# Patient Record
Sex: Male | Born: 1965 | Race: White | Hispanic: No | Marital: Married | Smoking: Never smoker
Health system: Southern US, Community
[De-identification: ages and names within clinical notes are randomized; demographics above are authoritative.]

## PROBLEM LIST (undated history)

## (undated) DIAGNOSIS — T7840XA Allergy, unspecified, initial encounter: Secondary | ICD-10-CM

## (undated) DIAGNOSIS — K219 Gastro-esophageal reflux disease without esophagitis: Secondary | ICD-10-CM

## (undated) HISTORY — DX: Allergy, unspecified, initial encounter: T78.40XA

## (undated) HISTORY — PX: MOUTH SURGERY: SHX715

## (undated) HISTORY — DX: Gastro-esophageal reflux disease without esophagitis: K21.9

---

## 2012-11-01 HISTORY — PX: VASECTOMY: SHX75

## 2014-01-31 ENCOUNTER — Ambulatory Visit: Payer: Self-pay | Admitting: Family Medicine

## 2015-01-03 ENCOUNTER — Emergency Department: Payer: BC Managed Care – PPO

## 2015-01-03 ENCOUNTER — Emergency Department
Admission: EM | Admit: 2015-01-03 | Discharge: 2015-01-04 | Disposition: A | Discharge status: Home / Self Care | Payer: BC Managed Care – PPO | Attending: Emergency Medicine | Admitting: Emergency Medicine

## 2015-01-03 ENCOUNTER — Encounter: Payer: Self-pay | Admitting: Emergency Medicine

## 2015-01-03 DIAGNOSIS — R1031 Right lower quadrant pain: Secondary | ICD-10-CM | POA: Diagnosis present

## 2015-01-03 DIAGNOSIS — K4090 Unilateral inguinal hernia, without obstruction or gangrene, not specified as recurrent: Secondary | ICD-10-CM | POA: Insufficient documentation

## 2015-01-03 LAB — COMPREHENSIVE METABOLIC PANEL
ALK PHOS: 52 U/L (ref 38–126)
ALT: 34 U/L (ref 17–63)
AST: 24 U/L (ref 15–41)
Albumin: 4.9 g/dL (ref 3.5–5.0)
Anion gap: 9 (ref 5–15)
BUN: 22 mg/dL — ABNORMAL HIGH (ref 6–20)
CO2: 25 mmol/L (ref 22–32)
CREATININE: 0.88 mg/dL (ref 0.61–1.24)
Calcium: 9.6 mg/dL (ref 8.9–10.3)
Chloride: 105 mmol/L (ref 101–111)
GFR calc Af Amer: >60 mL/min (ref >60)
Glucose, Bld: 92 mg/dL (ref 65–99)
Potassium: 3.4 mmol/L — ABNORMAL LOW (ref 3.5–5.1)
Sodium: 139 mmol/L (ref 135–145)
Total Bilirubin: 1.8 mg/dL — ABNORMAL HIGH (ref 0.3–1.2)
Total Protein: 7.9 g/dL (ref 6.5–8.1)

## 2015-01-03 LAB — CBC
HEMATOCRIT: 45.6 % (ref 40.0–52.0)
Hemoglobin: 15.6 g/dL (ref 13.0–18.0)
MCH: 28.3 pg (ref 26.0–34.0)
MCHC: 34.3 g/dL (ref 32.0–36.0)
MCV: 82.6 fL (ref 80.0–100.0)
PLATELETS: 175 10*3/uL (ref 150–440)
RBC: 5.52 MIL/uL (ref 4.40–5.90)
RDW: 14 % (ref 11.5–14.5)
WBC: 6.8 10*3/uL (ref 3.8–10.6)

## 2015-01-03 LAB — LIPASE, BLOOD: Lipase: 25 U/L (ref 22–51)

## 2015-01-03 MED ORDER — OXYCODONE-ACETAMINOPHEN 5-325 MG PO TABS: 1.0000 | ORAL_TABLET | Freq: Four times a day (QID) | ORAL | Status: DC | PRN

## 2015-01-03 MED ORDER — MORPHINE SULFATE 4 MG/ML IJ SOLN
4.0000 mg | Freq: Once | INTRAMUSCULAR | Status: DC
  Filled 2015-01-03: qty 1

## 2015-01-03 MED ORDER — IOHEXOL 300 MG/ML  SOLN
100.0000 mL | Freq: Once | INTRAMUSCULAR | Status: AC | PRN
  Administered 2015-01-03: 100 mL via INTRAVENOUS

## 2015-01-03 MED ORDER — IOHEXOL 240 MG/ML SOLN: 25.0000 mL | Freq: Once | INTRAMUSCULAR | Status: DC | PRN

## 2015-01-03 NOTE — ED Provider Notes (Signed)
-----------------------------------------   11:59 PM on 01/03/2015 -----------------------------------------  Patient care is him from Dr. Scotty Court. CT largely within normal limits besides some mild spermatic cord swelling, could be the result of the reduced hernia. Patient to follow up with general surgery on an outpatient basis. We will discharge the patient home. I discussed strict return precautions with the patient which he is agreeable.  Minna Antis, MD 01/03/15 [phone]

## 2015-01-03 NOTE — ED Notes (Signed)
Pt reports RLQ pain/right groin pain that started around 1830 tonight. Pt describes as a burning sensation, states "there's a bulge there". Pt denies nausea, vomiting or diarrhea. Pt able to urinate since then, swelling to bladder area, denies swelling to testicles.

## 2015-01-03 NOTE — ED Notes (Signed)
Pt returned from ct at this time

## 2015-01-03 NOTE — ED Notes (Signed)
MD Stafford at bedside. 

## 2015-01-03 NOTE — ED Provider Notes (Signed)
[facility] Emergency Department Provider Note  ____________________________________________  Time seen: 10:20 PM  I have reviewed the triage vital signs and the nursing notes.   HISTORY  Chief Complaint Groin Pain    HPI Joel Leon is a 49 y.o. male who complains of sudden onset worsening severe pain in the right lower abdomen that started about 6:30 PM tonight. When he got home around 8 PM he noticed that there was a bulge in the right groin area. He denies any pain in his testicles or scrotum. No dysuria or hematuria or penile discharge. No fevers or chills. Never had anything like this before. No heavy lifting or trauma. Only surgical history is vasectomy 2 years ago. Denies medical problems.  Pain is nonradiating, worsened with movement, no alleviating factors. No nausea vomiting diarrhea.   History reviewed. No pertinent past medical history.  There are no active problems to display for this patient.   History reviewed. No pertinent past surgical history.  Current Outpatient Rx  Name  Route  Sig  Dispense  Refill  . oxyCODONE-acetaminophen (ROXICET) 5-325 MG per tablet   Oral   Take 1 tablet by mouth every 6 (six) hours as needed for severe pain.   10 tablet   0    None Allergies Antihistamines, diphenhydramine-type; Decongestant; Milk-related compounds; and Sudafed  No family history on file.  Social History History  Substance Use Topics  . Smoking status: Never Smoker   . Smokeless tobacco: Not on file  . Alcohol Use: No    Review of Systems  Constitutional: No fever or chills. No weight changes Eyes:No blurry vision or double vision.  ENT: No sore throat. Cardiovascular: No chest pain. Respiratory: No dyspnea or cough. Gastrointestinal: Abdominal pain as above  No BRBPR or melena. Genitourinary: Negative for dysuria, urinary retention, bloody urine, or difficulty urinating. Musculoskeletal: Negative for back pain. No  joint swelling or pain. Skin: Negative for rash. Neurological: Negative for headaches, focal weakness or numbness. Psychiatric:No anxiety or depression.   Endocrine:No hot/cold intolerance, changes in energy, or sleep difficulty.  10-point ROS otherwise negative.  ____________________________________________   PHYSICAL EXAM:  VITAL SIGNS: ED Triage Vitals  Enc Vitals Group     BP 01/03/15 2122 158/90 mmHg     Pulse Rate 01/03/15 2122 69     Resp 01/03/15 2122 20     Temp 01/03/15 2122 97.6 F (36.4 C)     Temp Source 01/03/15 2122 Oral     SpO2 01/03/15 2122 97 %     Weight 01/03/15 2122 185 lb (83.915 kg)     Height 01/03/15 2122 5\' 11"  (1.803 m)     Head Cir --      Peak Flow --      Pain Score 01/03/15 2123 8     Pain Loc --      Pain Edu? --      Excl. in GC? --      Constitutional: Alert and oriented. Moderate distress due to pain. Eyes: No scleral icterus. No conjunctival pallor. PERRL. EOMI ENT   Head: Normocephalic and atraumatic.   Nose: No congestion/rhinnorhea. No septal hematoma   Mouth/Throat: MMM, no pharyngeal erythema. No peritonsillar mass. No uvula shift.   Neck: No stridor. No SubQ emphysema. No meningismus. Hematological/Lymphatic/Immunilogical: No cervical lymphadenopathy. Cardiovascular: RRR. Normal and symmetric distal pulses are present in all extremities. No murmurs, rubs, or gallops. Respiratory: Normal respiratory effort without tachypnea nor retractions. Breath sounds are clear and equal  bilaterally. No wheezes/rales/rhonchi. Gastrointestinal: Soft with 2-3 cm firm mass palpable in the right suprapubic area at Hesselbach's triangle. This area is exquisitely tender.. No distention. There is no CVA tenderness.  No rebound, rigidity, or guarding. Genitourinary: Normal external genitalia Musculoskeletal: Nontender with normal range of motion in all extremities. No joint effusions.  No lower extremity tenderness.  No  edema. Neurologic:   Normal speech and language.  CN 2-10 normal. Motor grossly intact. No pronator drift.  Normal gait. No gross focal neurologic deficits are appreciated.  Skin:  Skin is warm, dry and intact. No rash noted.  No petechiae, purpura, or bullae. Psychiatric: Mood and affect are normal. Speech and behavior are normal. Patient exhibits appropriate insight and judgment.  ____________________________________________    LABS (pertinent positives/negatives) (all labs ordered are listed, but only abnormal results are displayed) Labs Reviewed  COMPREHENSIVE METABOLIC PANEL - Abnormal; Notable for the following:    Potassium 3.4 (*)    BUN 22 (*)    Total Bilirubin 1.8 (*)    All other components within normal limits  LIPASE, BLOOD  CBC   ____________________________________________   EKG    ____________________________________________    RADIOLOGY  CT abdomen and pelvis pending  ____________________________________________   PROCEDURES Right-sided direct inguinal hernia reduced at bedside with firm pressure causing release of firm tender subcutaneous mass through the abdominal wall. There is a palpable fascial defect in the area after reduction. Pain and tenderness are substantially improved after reduction. ____________________________________________   INITIAL IMPRESSION / ASSESSMENT AND PLAN / ED COURSE  Pertinent labs & imaging results that were available during my care of the patient were reviewed by me and considered in my medical decision making (see chart for details).  Patient presents with clinically apparent right direct inguinal hernia. This was reduced at bedside on initial examination. We will obtain a CT scan of the abdomen and pelvis to further evaluate this injury can ensure full reduction and no evidence of obstruction. The patient can then hopefully be discharged to follow-up with surgery. We'll give IV morphine for now for symptom  control.  ----------------------------------------- 10:32 PM on 01/04/2015 -----------------------------------------  Late entry progress note. Care of the patient was signed out to the oncoming physician Dr. Lenard Lance at 11 PM on 01/03/2015. Patient medically stable and hernia was reduced at bedside, plan to follow-up with outpatient general surgery if CT scan did not reveal any severe findings. ____________________________________________   FINAL CLINICAL IMPRESSION(S) / ED DIAGNOSES  Final diagnoses:  Direct inguinal hernia      Sharman Cheek, MD 01/04/15 2233

## 2015-01-03 NOTE — Discharge Instructions (Signed)

## 2015-01-05 ENCOUNTER — Telehealth: Payer: Self-pay | Admitting: Surgery

## 2015-01-05 NOTE — Telephone Encounter (Signed)
Returned phone call to patient at this time. Patient states that he has had multiple instances where he has had to reduce hernia and this has been painful to him. Hernia has been reduced each time without difficulty per patient.   Denies nausea and vomiting, fever, and severe pain at this time.  Explained to the patient that he can go to the drug store to see if they have an Abdominal Binder or Ace Wrap to put on. Also encouraged to hold hernia area anytime that he is coughing or sneezing.   Moved up appointment to 01/11/15- 1300 with Dr. Egbert Garibaldi.

## 2015-01-05 NOTE — Telephone Encounter (Signed)
Patient would like to talk with a nurse, he was recently diagnosed with a hernia at the Emergency room, he has a appt with Dr.Ely 8/19, he is out of town in [place] and having some symptoms and has a few questions.

## 2015-01-11 ENCOUNTER — Other Ambulatory Visit: Payer: Self-pay | Admitting: Specialist

## 2015-01-11 ENCOUNTER — Ambulatory Visit (INDEPENDENT_AMBULATORY_CARE_PROVIDER_SITE_OTHER): Payer: BC Managed Care – PPO | Admitting: Surgery

## 2015-01-11 VITALS — BP 134/87 | HR 68 | Temp 98.1°F | Ht 71.0 in | Wt 187.0 lb

## 2015-01-11 DIAGNOSIS — M25462 Effusion, left knee: Secondary | ICD-10-CM

## 2015-01-11 DIAGNOSIS — K4090 Unilateral inguinal hernia, without obstruction or gangrene, not specified as recurrent: Secondary | ICD-10-CM | POA: Insufficient documentation

## 2015-01-11 NOTE — Patient Instructions (Signed)
You are requesting to have your Hernia repaired. We will arrange to have this done on 01/23/15 at [facility]  Please see additional information given for details.  Call our office with any questions or concerns that you may develop.

## 2015-01-11 NOTE — Progress Notes (Signed)
Patient ID: Joel Leon, male   DOB: Oct 05, 1965, 49 y.o.   MRN: [id number]  Chief Complaint  Patient presents with  . Follow-up    Hospital Follow Up    HPI  Joel Leon is a 49 y.o. male.  Without past medical history who approximately one week ago had the sudden onset of right groin pain and bulge. He went to the emergency room. Diagnosed with a hernia at which point this was reduced. Following this a CT scan was obtained which demonstrated some inflammatory changes seen around the spermatic cord but no obvious hernia. Patient denies any prior hernia in the past. No abdominal operations including hernia surgery. The patient denies any urinary symptoms consisting of either dysuria or hematuria poor stream or nocturia. Since the diagnosis of the hernia proximal one week ago the patient is had intermittent bulging of the hernia and some discomfort.  He is interested in surgical options.  Note today in the office he is complaining of left knee pain. He is to see orthopedic surgery soon regarding this.  Past Medical History  Diagnosis Date  . Allergy   . GERD (gastroesophageal reflux disease)     Past Surgical History  Procedure Laterality Date  . Vasectomy  11/01/2012    Family History  Problem Relation Age of Onset  . Dementia Maternal Grandmother     Social History Social History  Substance Use Topics  . Smoking status: Never Smoker   . Smokeless tobacco: None  . Alcohol Use: 0.0 oz/week    0 Standard drinks or equivalent per week    Allergies  Allergen Reactions  . Antihistamines, Diphenhydramine-Type   . Decongestant [Oxymetazoline]   . Milk-Related Compounds   . Sudafed [Pseudoephedrine Hcl]     Current Outpatient Prescriptions  Medication Sig Dispense Refill  . esomeprazole (NEXIUM) 40 MG capsule Take 40 mg by mouth daily at 12 noon.    Marland Kitchen oxyCODONE-acetaminophen (ROXICET) 5-325 MG per tablet Take 1 tablet by mouth every 6 (six) hours as needed for severe  pain. (Patient not taking: Reported on 01/11/2015) 10 tablet 0   No current facility-administered medications for this visit.      Blood pressure 134/87, pulse 68, temperature 98.1 F (36.7 C), temperature source Oral, height  (1.803 m), weight 187 lb (84.823 kg).   Review of Systems  Constitutional: Negative for fever, chills and weight loss.  HENT: Negative.   Respiratory: Negative for cough and hemoptysis.   Cardiovascular: Negative for chest pain and palpitations.  Gastrointestinal: Negative.   Genitourinary: Negative for dysuria, urgency, frequency, hematuria and flank pain.  Musculoskeletal: Positive for joint pain.  Skin: Negative.   Psychiatric/Behavioral: Negative.     Physical Exam  Constitutional: He is oriented to person, place, and time and well-developed, well-nourished, and in no distress. No distress.  HENT:  Head: Atraumatic.  Eyes: Conjunctivae are normal. Pupils are equal, round, and reactive to light.  Cardiovascular: Normal rate.   Pulmonary/Chest: Effort normal. No respiratory distress.  Abdominal: Soft. Normal appearance. He exhibits no distension. Bowel sounds are not tinkling. There is no tenderness. There is no rebound. A hernia is present. Hernia confirmed positive in the right inguinal area. Hernia confirmed negative in the umbilical area.    There is a reducible moderate sized right indirect inguinal hernia.  Neurological: He is oriented to person, place, and time. Gait normal.  Skin: He is not diaphoretic.  Psychiatric: Mood, memory, affect and judgment normal.   Physical Exam CONSTITUTIONAL:  Pleasant, well-developed, well-nourished, and in no acute distress. EYES: Pupils equal and reactive to light, Sclera non-icteric EARS, NOSE, MOUTH AND THROAT:  The oropharynx was clear.  Dentition is good repair.  Oral mucosa pink and moist. LYMPH NODES:  Lymph nodes in the neck and axillae were normal RESPIRATORY:  Lungs were clear.  Normal  respiratory effort without pathologic use of accessory muscles of respiration CARDIOVASCULAR: Heart was regular without murmurs.  There were no carotid bruits. GI: The abdomen was soft, nontender, and nondistended. There were no palpable masses. There was no hepatosplenomegaly. There were normal bowel sounds in all quadrants. GU:  Rectal deferred.   MUSCULOSKELETAL:  Normal muscle strength and tone.  No clubbing or cyanosis.   SKIN:  There were no pathologic skin lesions.  There were no nodules on palpation. NEUROLOGIC:  Sensation is normal.  Cranial nerves are grossly intact. PSYCH:  Oriented to person, place and time.  Mood and affect are normal.  Data Reviewed I personally reviewed the noncontrast CT scan obtained date of his admission to the emergency room.  I have personally reviewed the patient's imaging, laboratory findings and medical records.    Assessment      49 year old male otherwise healthy with symptomatic right inguinal hernia. Left knee pain will be addressed by orthopedic surgery. Plan    I discussed with him the various options of repair including open and laparoscopically. I discussed with him a totally extraperitoneal approach with mesh. He understands and wishes to proceed. We discussed the small risk of recurrence, infection, cosmetic deformity, ischemic orchitis, and chronic pain. All of his questions were answered and he wishes to proceed. I discussed with him the expected postsurgical convalescence for this operative intervention.        Natale Lay MD, FACS 01/11/2015, 2:49 PM

## 2015-01-12 ENCOUNTER — Telehealth: Payer: Self-pay

## 2015-01-12 NOTE — Telephone Encounter (Signed)
Patient seen in office this week.   Spoke with [place] in OR to schedule surgery. Case # [id number]  Scheduled for Laparoscopic Right Inguinal Hernia Repair with Mesh and Balloon on 01/23/15 at 0830 with Dr. Egbert Garibaldi.  Dr. Excell Seltzer to assist and is aware of surgery date/time.  Pre-admission testing interview to be done over the phone on 8/18 between 12-5pm.

## 2015-01-12 NOTE — Telephone Encounter (Signed)
Please call patient with surgery, pre-admission, and insurance details.  Surgical orders are in chart and complete. Patient does not need to have pre-admission testing as this was just done during emergency visit.

## 2015-01-16 ENCOUNTER — Ambulatory Visit: Payer: BC Managed Care – PPO

## 2015-01-16 NOTE — Telephone Encounter (Signed)
Pt advised of pre op date/time and sx date. Sx: 01/23/15 with Dr Chales Abrahams right inguinal hernia repair with mesh and balloon Pre op: 01/18/15 bw 1-5pm--telephone.   Pt advised of physician estimate--$126.24. Pt stated he did not like paying over the phone with Credit Card. He was advised he could pay this after receiving bill.

## 2015-01-17 ENCOUNTER — Ambulatory Visit
Admission: RE | Admit: 2015-01-17 | Discharge: 2015-01-17 | Disposition: A | Discharge status: Home / Self Care | Payer: BC Managed Care – PPO | Source: Ambulatory Visit | Attending: Specialist | Admitting: Specialist

## 2015-01-17 DIAGNOSIS — M25562 Pain in left knee: Secondary | ICD-10-CM | POA: Diagnosis present

## 2015-01-17 DIAGNOSIS — M25462 Effusion, left knee: Secondary | ICD-10-CM | POA: Diagnosis not present

## 2015-01-17 DIAGNOSIS — S83232A Complex tear of medial meniscus, current injury, left knee, initial encounter: Secondary | ICD-10-CM | POA: Diagnosis not present

## 2015-01-17 DIAGNOSIS — X58XXXA Exposure to other specified factors, initial encounter: Secondary | ICD-10-CM | POA: Insufficient documentation

## 2015-01-18 ENCOUNTER — Encounter: Payer: Self-pay | Admitting: *Deleted

## 2015-01-18 ENCOUNTER — Inpatient Hospital Stay: Admission: RE | Admit: 2015-01-18 | Payer: BC Managed Care – PPO | Source: Ambulatory Visit

## 2015-01-18 DIAGNOSIS — Z888 Allergy status to other drugs, medicaments and biological substances status: Secondary | ICD-10-CM | POA: Diagnosis not present

## 2015-01-18 DIAGNOSIS — Z91011 Allergy to milk products: Secondary | ICD-10-CM | POA: Diagnosis not present

## 2015-01-18 DIAGNOSIS — Z79899 Other long term (current) drug therapy: Secondary | ICD-10-CM | POA: Diagnosis not present

## 2015-01-18 DIAGNOSIS — K219 Gastro-esophageal reflux disease without esophagitis: Secondary | ICD-10-CM | POA: Diagnosis not present

## 2015-01-18 DIAGNOSIS — K4090 Unilateral inguinal hernia, without obstruction or gangrene, not specified as recurrent: Secondary | ICD-10-CM | POA: Diagnosis present

## 2015-01-18 DIAGNOSIS — Z8489 Family history of other specified conditions: Secondary | ICD-10-CM | POA: Diagnosis not present

## 2015-01-18 MED ORDER — SODIUM CHLORIDE 0.9 % IJ SOLN
INTRAMUSCULAR | Status: AC
  Filled 2015-01-18: qty 10

## 2015-01-18 NOTE — Patient Instructions (Signed)
  Your procedure is scheduled on:01/23/15 Report to Day Surgery. [facility] SECOND FLOOR To find out your arrival time please call [phone] between 1PM - 3PM on8/22/16 Remember: Instructions that are not followed completely may result in serious medical risk, up to and including death, or upon the discretion of your surgeon and anesthesiologist your surgery may need to be rescheduled.    X____ 1. Do not eat food or drink liquids after midnight. No gum chewing or hard candies.     _X___ 2. No Alcohol for 24 hours before or after surgery.   ____ 3. Bring all medications with you on the day of surgery if instructed.    ___X_ 4. Notify your doctor if there is any change in your medical condition     (cold, fever, infections).     Do not wear jewelry, make-up, hairpins, clips or nail polish.  Do not wear lotions, powders, or perfumes. You may wear deodorant.  Do not shave 48 hours prior to surgery. Men may shave face and neck.  Do not bring valuables to the hospital.    [facility] is not responsible for any belongings or valuables.               Contacts, dentures or bridgework may not be worn into surgery.  Leave your suitcase in the car. After surgery it may be brought to your room.  For patients admitted to the hospital, discharge time is determined by your                treatment team.   Patients discharged the day of surgery will not be allowed to drive home.   Please read over the following fact sheets that you were given:   Surgical Site Infection Prevention   _X___ Take these medicines the morning of surgery with A SIP OF WATER:    1. NEXIUM  2.   3.   4.  5.  6.  ____ Fleet Enema (as directed)   _X___ Use CHG Soap as directed  ____ Use inhalers on the day of surgery  ____ Stop metformin 2 days prior to surgery    ____ Take 1/2 of usual insulin dose the night before surgery and none on the morning of surgery.   ____ Stop Coumadin/Plavix/aspirin on   ____  Stop Anti-inflammatories on    ____ Stop supplements until after surgery.    ____ Bring C-Pap to the hospital.

## 2015-01-19 ENCOUNTER — Ambulatory Visit: Payer: Self-pay | Admitting: Surgery

## 2015-01-23 ENCOUNTER — Ambulatory Visit: Payer: BC Managed Care – PPO | Admitting: Certified Registered Nurse Anesthetist

## 2015-01-23 ENCOUNTER — Ambulatory Visit
Admission: RE | Admit: 2015-01-23 | Discharge: 2015-01-23 | Disposition: A | Discharge status: Home / Self Care | Payer: BC Managed Care – PPO | Source: Ambulatory Visit | Attending: Surgery | Admitting: Surgery

## 2015-01-23 ENCOUNTER — Encounter
Admission: RE | Disposition: A | Discharge status: Home / Self Care | Payer: Self-pay | Source: Ambulatory Visit | Attending: Surgery

## 2015-01-23 ENCOUNTER — Encounter: Payer: Self-pay | Admitting: *Deleted

## 2015-01-23 DIAGNOSIS — K219 Gastro-esophageal reflux disease without esophagitis: Secondary | ICD-10-CM | POA: Insufficient documentation

## 2015-01-23 DIAGNOSIS — Z8489 Family history of other specified conditions: Secondary | ICD-10-CM | POA: Insufficient documentation

## 2015-01-23 DIAGNOSIS — K4090 Unilateral inguinal hernia, without obstruction or gangrene, not specified as recurrent: Secondary | ICD-10-CM | POA: Diagnosis not present

## 2015-01-23 DIAGNOSIS — Z91011 Allergy to milk products: Secondary | ICD-10-CM | POA: Insufficient documentation

## 2015-01-23 DIAGNOSIS — K469 Unspecified abdominal hernia without obstruction or gangrene: Secondary | ICD-10-CM | POA: Insufficient documentation

## 2015-01-23 DIAGNOSIS — Z79899 Other long term (current) drug therapy: Secondary | ICD-10-CM | POA: Insufficient documentation

## 2015-01-23 DIAGNOSIS — Z888 Allergy status to other drugs, medicaments and biological substances status: Secondary | ICD-10-CM | POA: Insufficient documentation

## 2015-01-23 HISTORY — PX: INGUINAL HERNIA REPAIR: SHX194

## 2015-01-23 SURGERY — REPAIR, HERNIA, INGUINAL, LAPAROSCOPIC
Anesthesia: General | Wound class: Clean

## 2015-01-23 MED ORDER — ENOXAPARIN SODIUM 40 MG/0.4ML ~~LOC~~ SOLN
40.0000 mg | Freq: Once | SUBCUTANEOUS | Status: AC
  Administered 2015-01-23: 40 mg via SUBCUTANEOUS
  Filled 2015-01-23: qty 0.4

## 2015-01-23 MED ORDER — CEFAZOLIN SODIUM-DEXTROSE 2-3 GM-% IV SOLR
2.0000 g | INTRAVENOUS | Status: AC
  Administered 2015-01-23: 2 g via INTRAVENOUS

## 2015-01-23 MED ORDER — ROCURONIUM BROMIDE 100 MG/10ML IV SOLN
INTRAVENOUS | Status: DC | PRN
Start: 2015-01-23 — End: 2015-01-23
  Administered 2015-01-23: 5 mg via INTRAVENOUS
  Administered 2015-01-23: 30 mg via INTRAVENOUS
  Administered 2015-01-23 (×2): 10 mg via INTRAVENOUS

## 2015-01-23 MED ORDER — CEFAZOLIN SODIUM-DEXTROSE 2-3 GM-% IV SOLR
INTRAVENOUS | Status: AC
Start: 2015-01-23 — End: 2015-01-23
  Administered 2015-01-23: 2 g via INTRAVENOUS
  Filled 2015-01-23: qty 50

## 2015-01-23 MED ORDER — SUGAMMADEX SODIUM 200 MG/2ML IV SOLN
INTRAVENOUS | Status: DC | PRN
  Administered 2015-01-23: 160 mg via INTRAVENOUS

## 2015-01-23 MED ORDER — ONDANSETRON HCL 4 MG/2ML IJ SOLN
INTRAMUSCULAR | Status: DC | PRN
  Administered 2015-01-23: 4 mg via INTRAVENOUS

## 2015-01-23 MED ORDER — PROPOFOL 10 MG/ML IV BOLUS
INTRAVENOUS | Status: DC | PRN
  Administered 2015-01-23: 80 mg via INTRAVENOUS
  Administered 2015-01-23: 150 mg via INTRAVENOUS
  Administered 2015-01-23: 100 mg via INTRAVENOUS

## 2015-01-23 MED ORDER — DEXAMETHASONE SODIUM PHOSPHATE 4 MG/ML IJ SOLN
INTRAMUSCULAR | Status: DC | PRN
  Administered 2015-01-23: 10 mg via INTRAVENOUS

## 2015-01-23 MED ORDER — FENTANYL CITRATE (PF) 100 MCG/2ML IJ SOLN
INTRAMUSCULAR | Status: DC
Start: 2015-01-23 — End: 2015-01-23
  Filled 2015-01-23: qty 2

## 2015-01-23 MED ORDER — OXYCODONE-ACETAMINOPHEN 5-325 MG PO TABS: 2.0000 | ORAL_TABLET | Freq: Four times a day (QID) | ORAL | Status: DC | PRN

## 2015-01-23 MED ORDER — CHLORHEXIDINE GLUCONATE 4 % EX LIQD: 1.0000 "application " | Freq: Once | CUTANEOUS | Status: DC

## 2015-01-23 MED ORDER — ONDANSETRON HCL 4 MG/2ML IJ SOLN: 4.0000 mg | Freq: Once | INTRAMUSCULAR | Status: DC | PRN

## 2015-01-23 MED ORDER — BUPIVACAINE HCL (PF) 0.25 % IJ SOLN
INTRAMUSCULAR | Status: AC
  Filled 2015-01-23: qty 30

## 2015-01-23 MED ORDER — KETOROLAC TROMETHAMINE 30 MG/ML IJ SOLN
INTRAMUSCULAR | Status: DC | PRN
  Administered 2015-01-23: 30 mg via INTRAVENOUS

## 2015-01-23 MED ORDER — MIDAZOLAM HCL 2 MG/2ML IJ SOLN
INTRAMUSCULAR | Status: DC | PRN
  Administered 2015-01-23: 2 mg via INTRAVENOUS

## 2015-01-23 MED ORDER — SUCCINYLCHOLINE CHLORIDE 20 MG/ML IJ SOLN
INTRAMUSCULAR | Status: DC | PRN
  Administered 2015-01-23: 80 mg via INTRAVENOUS
  Administered 2015-01-23: 120 mg via INTRAVENOUS

## 2015-01-23 MED ORDER — FENTANYL CITRATE (PF) 100 MCG/2ML IJ SOLN
25.0000 ug | INTRAMUSCULAR | Status: DC | PRN
  Administered 2015-01-23: 25 ug via INTRAVENOUS

## 2015-01-23 MED ORDER — FENTANYL CITRATE (PF) 100 MCG/2ML IJ SOLN
INTRAMUSCULAR | Status: DC | PRN
  Administered 2015-01-23 (×2): 100 ug via INTRAVENOUS
  Administered 2015-01-23: 50 ug via INTRAVENOUS

## 2015-01-23 MED ORDER — BUPIVACAINE HCL (PF) 0.25 % IJ SOLN
INTRAMUSCULAR | Status: DC | PRN
  Administered 2015-01-23: 16 mL

## 2015-01-23 MED ORDER — LIDOCAINE HCL (CARDIAC) 20 MG/ML IV SOLN
INTRAVENOUS | Status: DC | PRN
  Administered 2015-01-23: 60 mg via INTRAVENOUS

## 2015-01-23 MED ORDER — LACTATED RINGERS IV SOLN
INTRAVENOUS | Status: DC
  Administered 2015-01-23: 08:00:00 via INTRAVENOUS

## 2015-01-23 SURGICAL SUPPLY — 44 items
CANISTER SUCT 1200ML W/VALVE (MISCELLANEOUS) ×3 IMPLANT
CATH TRAY 16F METER LATEX (MISCELLANEOUS) ×3 IMPLANT
CHLORAPREP W/TINT 26ML (MISCELLANEOUS) ×3 IMPLANT
CLOSURE WOUND 1/2 X4 (GAUZE/BANDAGES/DRESSINGS) ×1
DEFOGGER SCOPE WARMER CLEARIFY (MISCELLANEOUS) ×3 IMPLANT
DISSECT BALLN SPACEMKR OVL PDB (BALLOONS)
DISSECT BALLN SPACEMKR RND PDB (MISCELLANEOUS) ×3
DISSECTOR BALLN SPCMKR OVL PDB (BALLOONS) IMPLANT
DISSECTOR BALLN SPCMKR RND PDB (MISCELLANEOUS) ×1 IMPLANT
DRAPE SHEET LG 3/4 BI-LAMINATE (DRAPES) ×3 IMPLANT
DRAPE UTILITY 15X26 TOWEL STRL (DRAPES) ×6 IMPLANT
DRSG TEGADERM 2-3/8X2-3/4 SM (GAUZE/BANDAGES/DRESSINGS) ×9 IMPLANT
DRSG TELFA 3X8 NADH (GAUZE/BANDAGES/DRESSINGS) ×3 IMPLANT
GLOVE BIO SURGEON STRL SZ7.5 (GLOVE) ×15 IMPLANT
GOWN STRL REUS W/ TWL LRG LVL3 (GOWN DISPOSABLE) ×3 IMPLANT
GOWN STRL REUS W/ TWL XL LVL3 (GOWN DISPOSABLE) IMPLANT
GOWN STRL REUS W/TWL LRG LVL3 (GOWN DISPOSABLE) ×6
GOWN STRL REUS W/TWL XL LVL3 (GOWN DISPOSABLE)
IRRIGATION STRYKERFLOW (MISCELLANEOUS) IMPLANT
IRRIGATOR STRYKERFLOW (MISCELLANEOUS)
IV NS 1000ML (IV SOLUTION) ×2
IV NS 1000ML BAXH (IV SOLUTION) ×1 IMPLANT
JELLY LUB 2OZ STRL (MISCELLANEOUS) ×2
JELLY LUBE 2OZ STRL (MISCELLANEOUS) ×1 IMPLANT
KIT RM TURNOVER STRD PROC AR (KITS) ×3 IMPLANT
LABEL OR SOLS (LABEL) IMPLANT
MESH HERNIA 4X6 PROLITE RECT (Mesh General) ×1 IMPLANT
MESH POLY 4X6 (Mesh General) ×2 IMPLANT
NDL SAFETY 25GX1.5 (NEEDLE) ×3 IMPLANT
NS IRRIG 500ML POUR BTL (IV SOLUTION) ×3 IMPLANT
PACK LAP CHOLECYSTECTOMY (MISCELLANEOUS) ×3 IMPLANT
PAD GROUND ADULT SPLIT (MISCELLANEOUS) ×3 IMPLANT
SLEEVE ENDOPATH XCEL 5M (ENDOMECHANICALS) ×3 IMPLANT
STRIP CLOSURE SKIN 1/2X4 (GAUZE/BANDAGES/DRESSINGS) ×2 IMPLANT
SUT MNCRL 4-0 (SUTURE) ×2
SUT MNCRL 4-0 27XMFL (SUTURE) ×1
SUT VIC AB 0 CT2 27 (SUTURE) ×3 IMPLANT
SUTURE MNCRL 4-0 27XMF (SUTURE) ×1 IMPLANT
SWABSTK COMLB BENZOIN TINCTURE (MISCELLANEOUS) ×3 IMPLANT
TACKER 5MM HERNIA 3.5CML NAB (ENDOMECHANICALS) ×3 IMPLANT
TROCAR 5MM SINGLE VERSAONE (TROCAR) ×6 IMPLANT
TROCAR BALLN 10M OMST10SB SPAC (TROCAR) ×3 IMPLANT
TROCAR XCEL NON-BLD 5MMX100MML (ENDOMECHANICALS) ×3 IMPLANT
TUBING INSUFFLATOR HI FLOW (MISCELLANEOUS) ×3 IMPLANT

## 2015-01-23 NOTE — Anesthesia Procedure Notes (Addendum)
Procedure Name: Intubation Performed by: Malva Cogan Pre-anesthesia Checklist: Patient identified, Emergency Drugs available, Suction available, Patient being monitored and Timeout performed Patient Re-evaluated:Patient Re-evaluated prior to inductionOxygen Delivery Method: Circle system utilized Preoxygenation: Pre-oxygenation with 100% oxygen Intubation Type: IV induction Ventilation: Mask ventilation without difficulty Laryngoscope Size: Mac and 4 Grade View: Grade III Tube type: Oral Tube size: 7.5 mm Number of attempts: 3 Airway Equipment and Method: Stylet,  Video-laryngoscopy,  Rigid stylet and Bougie stylet Placement Confirmation: ETT inserted through vocal cords under direct vision,  positive ETCO2,  CO2 detector and breath sounds checked- equal and bilateral Secured at: 24 cm Tube secured with: Tape Dental Injury: Teeth and Oropharynx as per pre-operative assessment  Difficulty Due To: Difficulty was anticipated and Difficult Airway- due to anterior larynx Future Recommendations: Recommend- induction with short-acting agent, and alternative techniques readily available and Recommend- awake intubation

## 2015-01-23 NOTE — Anesthesia Preprocedure Evaluation (Addendum)
Anesthesia Evaluation  Patient identified by MRN, date of birth, ID band Patient awake    Reviewed: Allergy & Precautions, NPO status   Airway Mallampati: III  TM Distance: <3 FB Neck ROM: Full   Comment: Small mouth Dental no notable dental hx. (+) Caps Bridge and small mouth:   Pulmonary neg pulmonary ROS,  breath sounds clear to auscultation  Pulmonary exam normal       Cardiovascular negative cardio ROS Normal cardiovascular exam    Neuro/Psych negative neurological ROS  negative psych ROS   GI/Hepatic Neg liver ROS, GERD-  Medicated and Controlled,  Endo/Other  negative endocrine ROS  Renal/GU negative Renal ROS  negative genitourinary   Musculoskeletal negative musculoskeletal ROS (+)   Abdominal Normal abdominal exam  (+)   Peds negative pediatric ROS (+)  Hematology negative hematology ROS (+)   Anesthesia Other Findings   Reproductive/Obstetrics                            Anesthesia Physical Anesthesia Plan  ASA: II  Anesthesia Plan: General   Post-op Pain Management:    Induction: Intravenous  Airway Management Planned: Oral ETT  Additional Equipment:   Intra-op Plan:   Post-operative Plan: Extubation in OR  Informed Consent: I have reviewed the patients History and Physical, chart, labs and discussed the procedure including the risks, benefits and alternatives for the proposed anesthesia with the patient or authorized representative who has indicated his/her understanding and acceptance.   Dental advisory given  Plan Discussed with: CRNA and Surgeon  Anesthesia Plan Comments:         Anesthesia Quick Evaluation

## 2015-01-23 NOTE — Interval H&P Note (Signed)
History and Physical Interval Note:  01/23/2015 8:22 AM  Joel Leon  has presented today for surgery, with the diagnosis of RIGHT INGUINAL HERNIA  The various methods of treatment have been discussed with the patient and family. After consideration of risks, benefits and other options for treatment, the patient has consented to  Procedure(s): LAPAROSCOPIC INGUINAL HERNIA (Right) as a surgical intervention .  The patient's history has been reviewed, patient examined, no change in status, stable for surgery.  I have reviewed the patient's chart and labs.  Questions were answered to the patient's satisfaction.     Natale Lay

## 2015-01-23 NOTE — Anesthesia Postprocedure Evaluation (Signed)
  Anesthesia Post-op Note  Patient: Joel Leon  Procedure(s) Performed: Procedure(s): Laparoscopic right inguinal hernia repair  (N/A)  Anesthesia type:General  Patient location: PACU  Post pain: Pain level controlled  Post assessment: Post-op Vital signs reviewed, Patient's Cardiovascular Status Stable, Respiratory Function Stable, Patent Airway and No signs of Nausea or vomiting  Post vital signs: Reviewed and stable  Last Vitals:  Filed Vitals:   01/23/15 1216  BP: 124/79  Pulse: 59  Temp:   Resp: 16    Level of consciousness: awake, alert  and patient cooperative  Complications: No apparent anesthesia complications

## 2015-01-23 NOTE — H&P (View-Only) (Signed)
Patient ID: Joel Leon, male   DOB: [birth year], 49 y.o.   MRN: [id number]  Chief Complaint  Patient presents with  . Follow-up    Hospital Follow Up    HPI  Joel Leon is a 49 y.o. male.  Without past medical history who approximately one week ago had the sudden onset of right groin pain and bulge. He went to the emergency room. Diagnosed with a hernia at which point this was reduced. Following this a CT scan was obtained which demonstrated some inflammatory changes seen around the spermatic cord but no obvious hernia. Patient denies any prior hernia in the past. No abdominal operations including hernia surgery. The patient denies any urinary symptoms consisting of either dysuria or hematuria poor stream or nocturia. Since the diagnosis of the hernia proximal one week ago the patient is had intermittent bulging of the hernia and some discomfort.  He is interested in surgical options.  Note today in the office he is complaining of left knee pain. He is to see orthopedic surgery soon regarding this.  Past Medical History  Diagnosis Date  . Allergy   . GERD (gastroesophageal reflux disease)     Past Surgical History  Procedure Laterality Date  . Vasectomy  11/01/2012    Family History  Problem Relation Age of Onset  . Dementia Maternal Grandmother     Social History Social History  Substance Use Topics  . Smoking status: Never Smoker   . Smokeless tobacco: None  . Alcohol Use: 0.0 oz/week    0 Standard drinks or equivalent per week    Allergies  Allergen Reactions  . Antihistamines, Diphenhydramine-Type   . Decongestant [Oxymetazoline]   . Milk-Related Compounds   . Sudafed [Pseudoephedrine Hcl]     Current Outpatient Prescriptions  Medication Sig Dispense Refill  . esomeprazole (NEXIUM) 40 MG capsule Take 40 mg by mouth daily at 12 noon.    Marland Kitchen oxyCODONE-acetaminophen (ROXICET) 5-325 MG per tablet Take 1 tablet by mouth every 6 (six) hours as needed for severe  pain. (Patient not taking: Reported on 01/11/2015) 10 tablet 0   No current facility-administered medications for this visit.      Blood pressure 134/87, pulse 68, temperature 98.1 F (36.7 C), temperature source Oral, height  (1.803 m), weight 187 lb (84.823 kg).   Review of Systems  Constitutional: Negative for fever, chills and weight loss.  HENT: Negative.   Respiratory: Negative for cough and hemoptysis.   Cardiovascular: Negative for chest pain and palpitations.  Gastrointestinal: Negative.   Genitourinary: Negative for dysuria, urgency, frequency, hematuria and flank pain.  Musculoskeletal: Positive for joint pain.  Skin: Negative.   Psychiatric/Behavioral: Negative.     Physical Exam  Constitutional: He is oriented to person, place, and time and well-developed, well-nourished, and in no distress. No distress.  HENT:  Head: Atraumatic.  Eyes: Conjunctivae are normal. Pupils are equal, round, and reactive to light.  Cardiovascular: Normal rate.   Pulmonary/Chest: Effort normal. No respiratory distress.  Abdominal: Soft. Normal appearance. He exhibits no distension. Bowel sounds are not tinkling. There is no tenderness. There is no rebound. A hernia is present. Hernia confirmed positive in the right inguinal area. Hernia confirmed negative in the umbilical area.    There is a reducible moderate sized right indirect inguinal hernia.  Neurological: He is oriented to person, place, and time. Gait normal.  Skin: He is not diaphoretic.  Psychiatric: Mood, memory, affect and judgment normal.   Physical Exam CONSTITUTIONAL:  Pleasant, well-developed, well-nourished, and in no acute distress. EYES: Pupils equal and reactive to light, Sclera non-icteric EARS, NOSE, MOUTH AND THROAT:  The oropharynx was clear.  Dentition is good repair.  Oral mucosa pink and moist. LYMPH NODES:  Lymph nodes in the neck and axillae were normal RESPIRATORY:  Lungs were clear.  Normal  respiratory effort without pathologic use of accessory muscles of respiration CARDIOVASCULAR: Heart was regular without murmurs.  There were no carotid bruits. GI: The abdomen was soft, nontender, and nondistended. There were no palpable masses. There was no hepatosplenomegaly. There were normal bowel sounds in all quadrants. GU:  Rectal deferred.   MUSCULOSKELETAL:  Normal muscle strength and tone.  No clubbing or cyanosis.   SKIN:  There were no pathologic skin lesions.  There were no nodules on palpation. NEUROLOGIC:  Sensation is normal.  Cranial nerves are grossly intact. PSYCH:  Oriented to person, place and time.  Mood and affect are normal.  Data Reviewed I personally reviewed the noncontrast CT scan obtained date of his admission to the emergency room.  I have personally reviewed the patient's imaging, laboratory findings and medical records.    Assessment      49 year old male otherwise healthy with symptomatic right inguinal hernia. Left knee pain will be addressed by orthopedic surgery. Plan    I discussed with him the various options of repair including open and laparoscopically. I discussed with him a totally extraperitoneal approach with mesh. He understands and wishes to proceed. We discussed the small risk of recurrence, infection, cosmetic deformity, ischemic orchitis, and chronic pain. All of his questions were answered and he wishes to proceed. I discussed with him the expected postsurgical convalescence for this operative intervention.        Joel Lay MD, FACS 01/11/2015, 2:49 PM

## 2015-01-23 NOTE — Op Note (Signed)
01/23/2015  10:14 AM  PATIENT:  Alethia Berthold  49 y.o. male  PRE-OPERATIVE DIAGNOSIS:  RIGHT INGUINAL HERNIA  POST-OPERATIVE DIAGNOSIS:  RIGHT INGUINAL HERNIA  PROCEDURE:  Procedure(s): Laparoscopic right inguinal hernia repair  (N/A)  SURGEON:  Surgeon(s) and Role:    * Natale Lay, MD - Primary      ASSISTANTS: none  ANESTHESIA:general     SPECIMEN:none    EBL: minimal  Findings:  Large indirect and small direct inguinal hernia  Description of procedure:   With the patient supine position and general endotracheal anesthesia the patient's abdomen was clipped of hair a Foley catheter was placed under sterile technique. The abdomen was widely prepped and draped with chlor prep solution and a time out was observed.   An obliquely oriented infraumbilical skin incision was fashion with scalpel and carried down with blunt dissection to the anterior rectus sheath. The rectus sheath was divided to the right of the midline in a vertical orientation with the rectus muscle being swept laterally and the preperitoneal space being entered bluntly. The round Covidian dissecting balloon was placed and under direct visualization the space was insufflated. The dissecting balloon was then exchanged for an operating trocar with balloon. Two 5 mm trochars were then placed immediately below in the midline. Cooper's ligament being identified by dissection utilizing the balloon was further swept of fibrofatty tissue. The space lateral to the spermatic cord and hernia was identified. The course of the ilioinguinal nerve branch was identified and preserved. Dissection of the cord structure demonstrated a long fibrous indirect hernia sac. Hernia sac was entered near its base. Lipoma of the cord was reduced. A window was made inferior to the spermatic vessels and vas deferens. A small direct hernia defect was noted just medial to the vas and vessels and superior to Cooper's ligament.    A 4" x 6" scissored  Prolite mesh was then inserted into the preperitoneal space and then secured in the standard fashion to the Cooper's ligament anterior abdominal wall with the 2 tails being crisscrossed to allow egress of the spermatic cord. Hernia sac was obliterated from the space.  The two tails were then folded on top of each other and tacked laterally and anteriorly in the area away from the nerve.  Further tacks were then placed along Cooper's ligament. One tack was then placed on either side anteriorly of the epigastric vessels. The small rent in the peritoneum was reapproximated utilizing 2 pro tacks.  The preperitoneal space was then deflated under direct visualization no bowel entered. Trochars were then removed. A total of 30 cc of 0.25% plain Marcaine was infiltrated on the operative field. Rectus sheath anteriorly was reapproximated with a figure-of-eight #0 Vicryl suture. 4-0 Monocryl was used in subcuticular fashion to reapproximate skin edges followed by benzoin Steri-Strips Telfa and Tegaderm. Foley catheter was then removed at the end of the operation the patient was then transported in stable and satisfactory condition to cover room by anesthesia services.

## 2015-01-23 NOTE — Transfer of Care (Signed)
Immediate Anesthesia Transfer of Care Note  Patient: Joel Leon  Procedure(s) Performed: Procedure(s): Laparoscopic right inguinal hernia repair  (N/A)  Patient Location: PACU  Anesthesia Type:General  Level of Consciousness: awake, alert  and oriented  Airway & Oxygen Therapy: Patient Spontanous Breathing and Patient connected to face mask oxygen  Post-op Assessment: Report given to RN and Post -op Vital signs reviewed and stable  Post vital signs: Reviewed and stable  Last Vitals:  Filed Vitals:   01/23/15 1016  BP: 130/82  Pulse: 81  Temp: 36.3 C  Resp: 8    Complications: No apparent anesthesia complications

## 2015-01-25 ENCOUNTER — Telehealth: Payer: Self-pay | Admitting: Surgery

## 2015-01-25 NOTE — Telephone Encounter (Signed)
Called patient back. He wanted to ask what to expect after his inguinal hernia repair. I told him that being in some pain was normal, however, he stated that he doesn't have pain but soreness. He also stated that his testicles continue to be swollen. I recommended for him to get a rolled up towel with ice and place it under his testicles to help with the swelling. Patient understood and stated that he would do that. I also told patient that if he has any questions or concerns, to call us back. Patient understood.

## 2015-01-25 NOTE — Telephone Encounter (Signed)
Patient had Laparoscopic inguinal hernia surgery with Dr Egbert Garibaldi on 01/23/15 - patient would just like to speak with the nurse to go over a few things, such as how he should be healing, what to look for, etc. Please call and advise.

## 2015-01-29 DIAGNOSIS — S83249A Other tear of medial meniscus, current injury, unspecified knee, initial encounter: Secondary | ICD-10-CM | POA: Insufficient documentation

## 2015-01-30 NOTE — Addendum Note (Signed)
Addendum  created 01/30/15 1343 by Yves Dill, MD   Modules edited: Anesthesia Events, Narrator   Narrator:  Narrator: Event Log Edited

## 2015-02-08 ENCOUNTER — Ambulatory Visit (INDEPENDENT_AMBULATORY_CARE_PROVIDER_SITE_OTHER): Payer: BC Managed Care – PPO | Admitting: Surgery

## 2015-02-08 ENCOUNTER — Encounter: Payer: Self-pay | Admitting: Surgery

## 2015-02-08 VITALS — BP 123/86 | HR 69 | Temp 98.7°F | Ht 71.0 in | Wt 187.0 lb

## 2015-02-08 DIAGNOSIS — Z09 Encounter for follow-up examination after completed treatment for conditions other than malignant neoplasm: Secondary | ICD-10-CM

## 2015-02-08 NOTE — Patient Instructions (Signed)
Okay to resume heavy lifting when pain free.

## 2015-02-08 NOTE — Progress Notes (Signed)
Surgery clinic note  S: Some soreness in right groin and right testicle.  No longer requiring pain meds, no nausea/vomiting, regular PO pain meds.  O:Blood pressure 123/86, pulse 69, temperature 98.7 F (37.1 C), temperature source Oral, height  (1.803 m), weight 187 lb (84.823 kg). GEN: NAD/A&Ox3 GROIN: no swelling, min pain, incisions c/d/i, no recurrent hernia  A/P s/p lap right hernia, doing well - okay to begin lifting when pain free - no acute issues.

## 2015-02-22 HISTORY — PX: KNEE ARTHROSCOPY W/ PARTIAL MEDIAL MENISCECTOMY: SHX1882

## 2015-03-02 ENCOUNTER — Encounter: Payer: Self-pay | Admitting: Family Medicine

## 2016-08-17 IMAGING — CT CT ABD-PELV W/ CM
1 of 3 series · 14 of 32 positions shown, 19 images · IV contrast (omnipaque)
Comparison: None.

CLINICAL DATA: RIGHT lower quadrant pain radiating to the groin
beginning at 3469 hours, associated with burning sensation and lump.
Evaluate for inguinal hernia, s/p reduction in emergency department.

EXAM:
CT ABDOMEN AND PELVIS WITH CONTRAST
TECHNIQUE: Multidetector CT imaging of the abdomen and pelvis was performed
using the standard protocol following bolus administration of
intravenous contrast.
CONTRAST:  100mL OMNIPAQUE IOHEXOL 300 MG/ML  SOLN

[Series 2: routine abd pel with · axial · 0.76mm/px · z∈[-562,-78]mm · 14 of 109 slices shown, 19 images]
[im 6/109  soft-tissue]
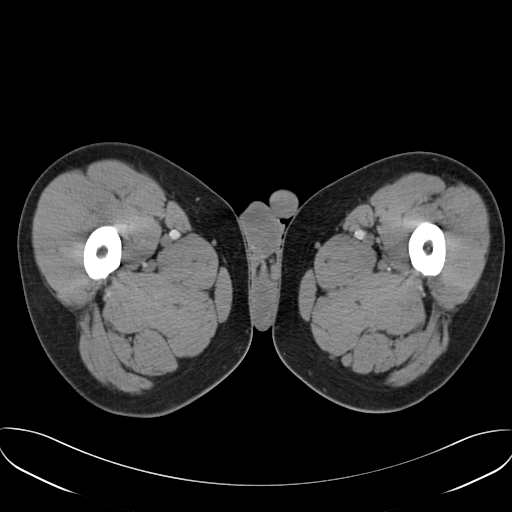
[im 6/109  bone]
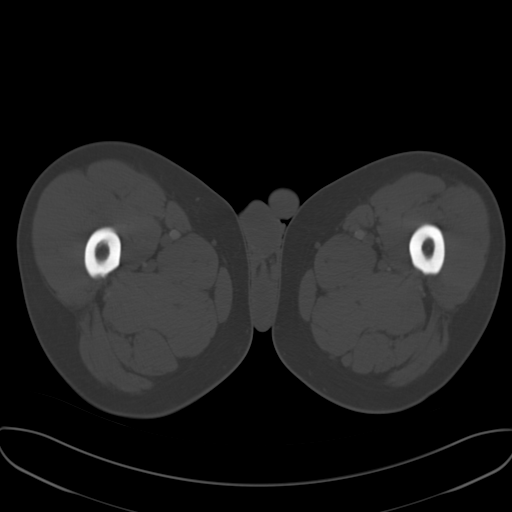
[im 17/109  soft-tissue]
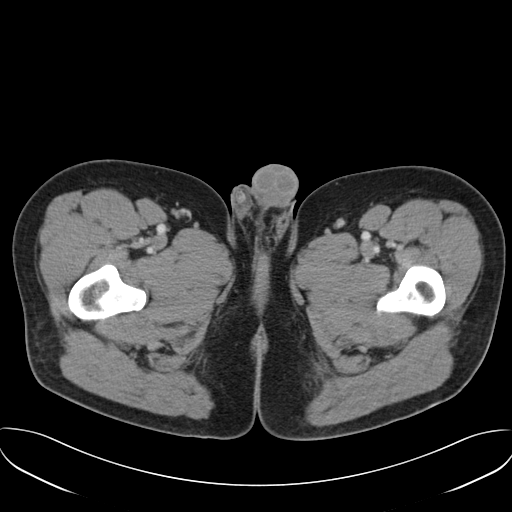
[im 22/109  soft-tissue]
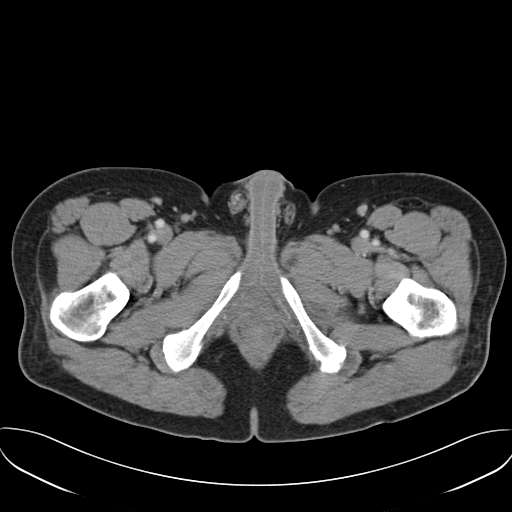
[im 33/109  soft-tissue]
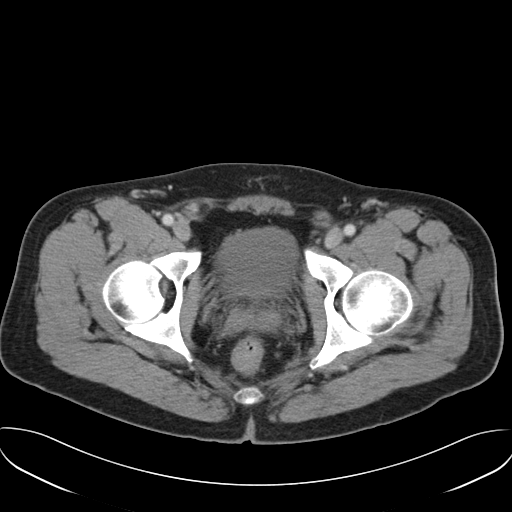
[im 38/109  soft-tissue]
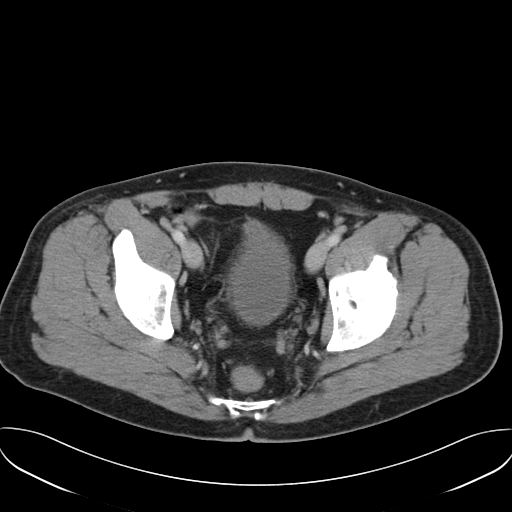
[im 49/109  soft-tissue]
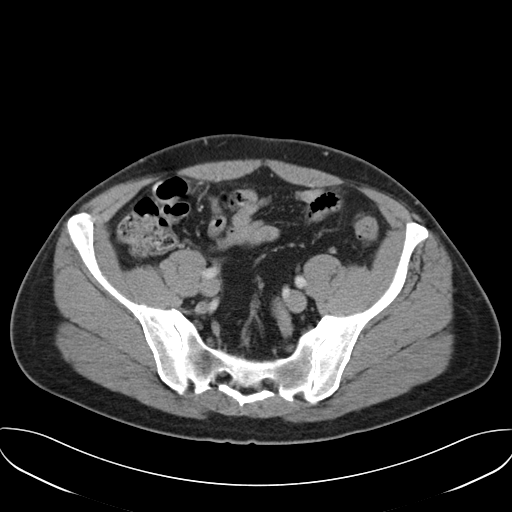
[im 55/109  soft-tissue]
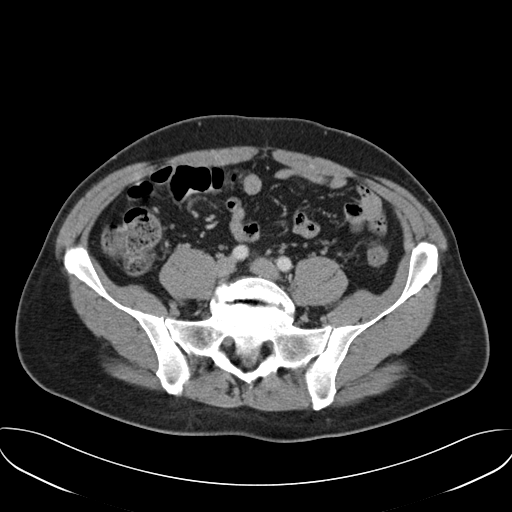
[im 60/109  soft-tissue]
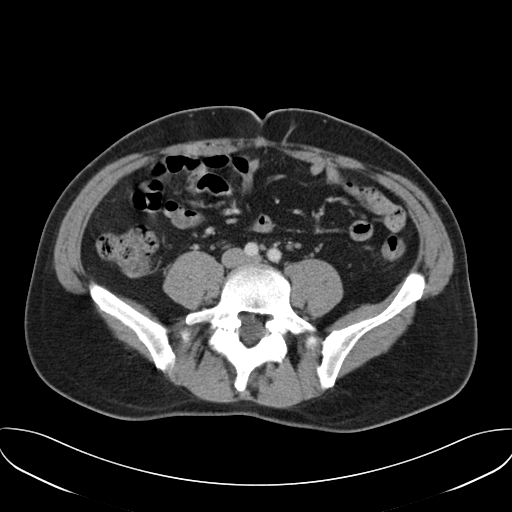
[im 71/109  soft-tissue]
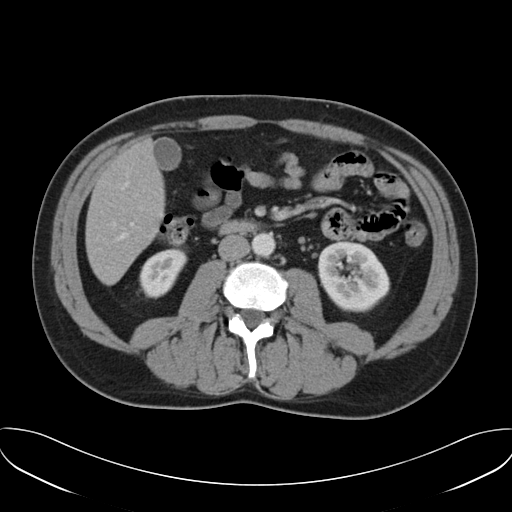
[im 71/109  bone]
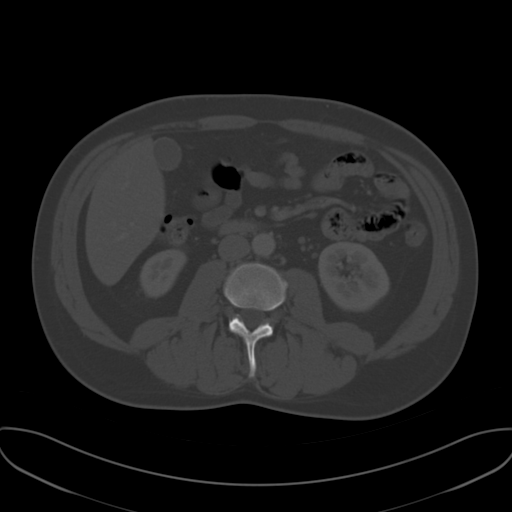
[im 76/109  soft-tissue]
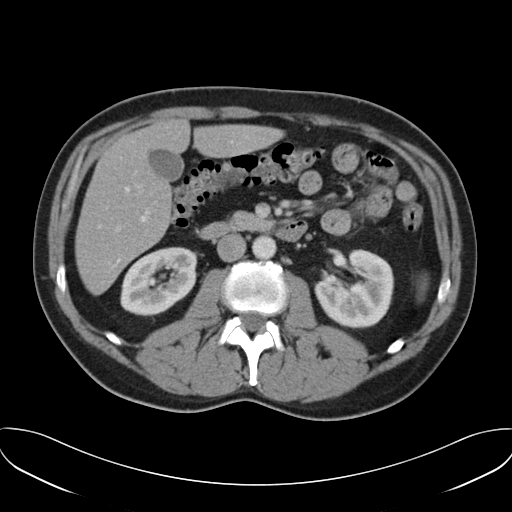
[im 87/109  soft-tissue]
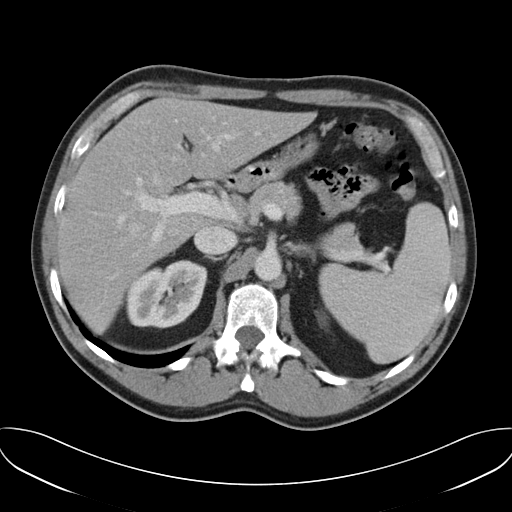
[im 87/109  lung]
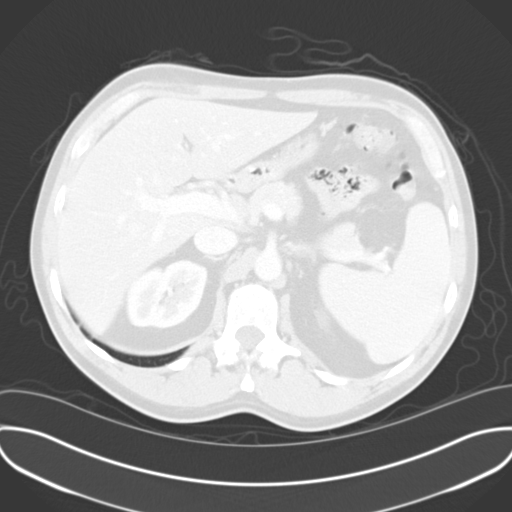
[im 92/109  soft-tissue]
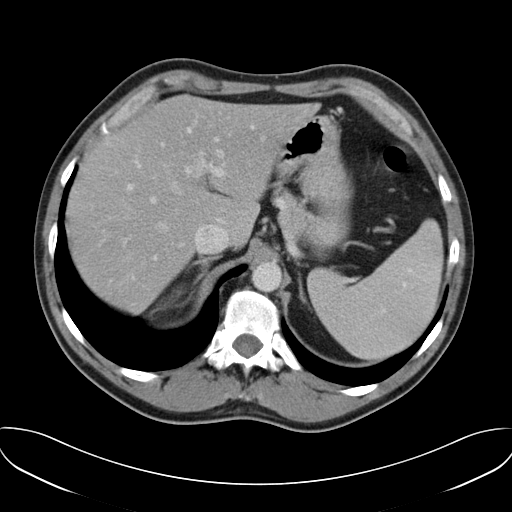
[im 92/109  lung]
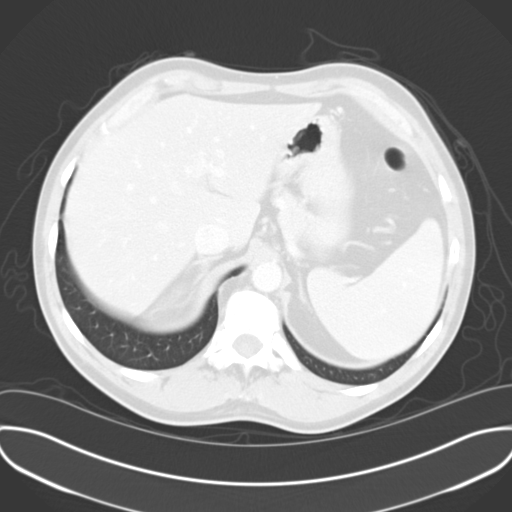
[im 98/109  lung]
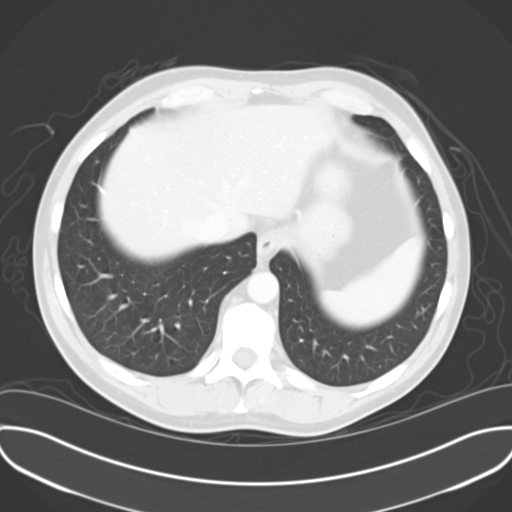
[im 103/109  soft-tissue]
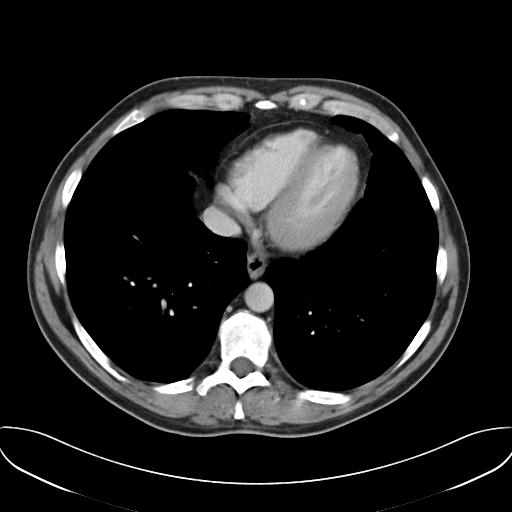
[im 103/109  lung]
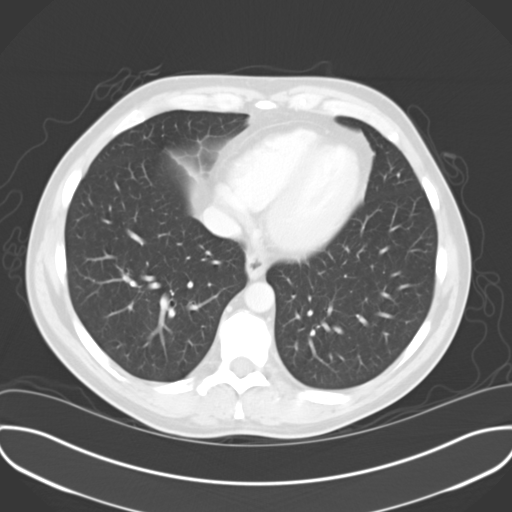

[14 of 32 positions shown; findings below may reference images not displayed]

FINDINGS: LUNG BASES: Included view of the lung bases are clear. Visualized
heart and pericardium are unremarkable.

SOLID ORGANS: The liver is diffusely hypodense consistent with
steatosis, otherwise unremarkable. Spleen, gallbladder, pancreas and
adrenal glands are unremarkable.

GASTROINTESTINAL TRACT: The stomach, small and large bowel are
normal in course and caliber without inflammatory changes. Normal
appendix.

KIDNEYS/ URINARY TRACT: Kidneys are orthotopic, demonstrating
symmetric enhancement. No nephrolithiasis, hydronephrosis or solid
renal masses. The unopacified ureters are normal in course and
caliber. Delayed imaging through the kidneys demonstrates symmetric
prompt contrast excretion within the proximal urinary collecting
system. Urinary bladder is partially distended and unremarkable.

PERITONEUM/RETROPERITONEUM: Aortoiliac vessels are normal in course
and caliber, trace calcific atherosclerosis. No lymphadenopathy by
CT size criteria. Prostate size is normal. No intraperitoneal free
fluid nor free air.

SOFT TISSUE/OSSEOUS STRUCTURES: Mildly thickened RIGHT spermatic
cord, without mass, free fluid, fluid collection or bowel. Grade 1
L5-S1 anterolisthesis, chronic bilateral L5 pars interarticularis
defects, severe RIGHT L5-S1 neural foraminal narrowing. Severe L5-S1
degenerative disc.
IMPRESSION: Mildly thickened RIGHT spermatic cord without CT findings of hernia
on this CT without Valsalva.

Normal appendix.  No urolithiasis.

## 2017-03-24 DIAGNOSIS — E7800 Pure hypercholesterolemia, unspecified: Secondary | ICD-10-CM | POA: Insufficient documentation

## 2017-03-24 DIAGNOSIS — M1710 Unilateral primary osteoarthritis, unspecified knee: Secondary | ICD-10-CM | POA: Insufficient documentation

## 2017-03-24 DIAGNOSIS — M179 Osteoarthritis of knee, unspecified: Secondary | ICD-10-CM | POA: Insufficient documentation

## 2017-03-24 DIAGNOSIS — K219 Gastro-esophageal reflux disease without esophagitis: Secondary | ICD-10-CM | POA: Insufficient documentation

## 2017-03-25 ENCOUNTER — Encounter: Payer: Self-pay | Admitting: Family Medicine

## 2017-03-25 ENCOUNTER — Ambulatory Visit (INDEPENDENT_AMBULATORY_CARE_PROVIDER_SITE_OTHER): Payer: BC Managed Care – PPO | Admitting: Family Medicine

## 2017-03-25 VITALS — BP 128/82 | Temp 98.1°F | Resp 16 | Ht 71.0 in | Wt 191.0 lb

## 2017-03-25 DIAGNOSIS — Z0000 Encounter for general adult medical examination without abnormal findings: Secondary | ICD-10-CM

## 2017-03-25 DIAGNOSIS — E7800 Pure hypercholesterolemia, unspecified: Secondary | ICD-10-CM

## 2017-03-25 DIAGNOSIS — K649 Unspecified hemorrhoids: Secondary | ICD-10-CM | POA: Diagnosis not present

## 2017-03-25 LAB — COMPLETE METABOLIC PANEL WITH GFR
AG Ratio: 1.5 (calc) (ref 1.0–2.5)
ALT: 38 U/L (ref 9–46)
AST: 24 U/L (ref 10–40)
Albumin: 4.3 g/dL (ref 3.6–5.1)
Alkaline phosphatase (APISO): 51 U/L (ref 40–115)
BILIRUBIN TOTAL: 0.8 mg/dL (ref 0.2–1.2)
BUN: 21 mg/dL (ref 7–25)
CALCIUM: 9.4 mg/dL (ref 8.6–10.3)
CHLORIDE: 103 mmol/L (ref 98–110)
CO2: 27 mmol/L (ref 20–32)
CREATININE: 0.85 mg/dL (ref 0.60–1.35)
GFR, EST AFRICAN AMERICAN: 119 mL/min/{1.73_m2} (ref >=60)
GFR, Est Non African American: 102 mL/min/{1.73_m2} (ref >=60)
GLUCOSE: 101 mg/dL — AB (ref 65–99)
Globulin: 2.8 g/dL (calc) (ref 1.9–3.7)
Potassium: 4.1 mmol/L (ref 3.5–5.3)
Sodium: 140 mmol/L (ref 135–146)
TOTAL PROTEIN: 7.1 g/dL (ref 6.1–8.1)

## 2017-03-25 LAB — LIPID PANEL
Cholesterol: 259 mg/dL — ABNORMAL HIGH (ref <200)
HDL: 54 mg/dL (ref >40)
LDL Cholesterol (Calc): 173 mg/dL (calc) — ABNORMAL HIGH
NON-HDL CHOLESTEROL (CALC): 205 mg/dL — AB (ref <130)
TRIGLYCERIDES: 170 mg/dL — AB (ref <150)
Total CHOL/HDL Ratio: 4.8 (calc) (ref <5.0)

## 2017-03-25 NOTE — Progress Notes (Signed)
Patient: Joel Leon, Male    DOB: 1965/06/06, 50 y.o.   MRN: [id number] Visit Date: 03/25/2017  Today's Provider: Mila Merry, MD   Chief Complaint  Patient presents with  . Annual Exam  . Hyperlipidemia  . Gastroesophageal Reflux   Subjective:    Annual physical exam Joel Leon is a 51 y.o. male who presents today for health maintenance and complete physical. He feels well. He reports exercising not regularly. He reports he is sleeping fairly well.  Tdap-06/19/2008    Lipid/Cholesterol, Follow-up:   Last seen for this 01/31/2014.  Management since that visit includes; labs checked, no changes.  Last Lipid Panel:   He reports good compliance with treatment. He is not having side effects.   Wt Readings from Last 3 Encounters:  03/25/17 191 lb (86.6 kg)  02/08/15 187 lb (84.8 kg)  01/11/15 187 lb (84.8 kg)     GERD From 01/31/2014-samples of Dexilant given to patient for 10 days.  He also complains of recurrent problems with hemorrhoids for several months which have been aggravated him. He is interested in having them removed.   Review of Systems  Constitutional: Negative.   HENT: Negative.   Eyes: Negative.   Respiratory: Negative.   Cardiovascular: Negative.   Gastrointestinal: Negative.   Endocrine: Negative.   Genitourinary: Negative.   Musculoskeletal: Positive for back pain.  Skin: Negative.   Allergic/Immunologic: Negative.   Neurological: Negative.   Hematological: Negative.   Psychiatric/Behavioral: Negative.     Social History      He  reports that he has never smoked. He has never used smokeless tobacco. He reports that he drinks alcohol. He reports that he does not use drugs.       Social History   Social History  . Marital status: Married    Spouse name: N/A  . Number of children: N/A  . Years of education: N/A   Social History Main Topics  . Smoking status: Never Smoker  . Smokeless tobacco: Never Used  . Alcohol use  0.0 oz/week     Comment: 2 DRINKS PER DAY  . Drug use: No  . Sexual activity: Not Asked   Other Topics Concern  . None   Social History Narrative  . None    Past Medical History:  Diagnosis Date  . Allergy   . GERD (gastroesophageal reflux disease)      Patient Active Problem List   Diagnosis Date Noted  . Acid reflux 03/24/2017  . Hypercholesterolemia 03/24/2017  . 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 Hernia of abdominal cavity   . Inguinal hernia 01/11/2015    Past Surgical History:  Procedure Laterality Date  . INGUINAL HERNIA REPAIR N/A 01/23/2015   Procedure: Laparoscopic right inguinal hernia repair ;  Surgeon: Natale Lay, MD;  Location: [facility] ORS;  Service: General;  Laterality: N/A;  . KNEE ARTHROSCOPY W/ PARTIAL MEDIAL MENISCECTOMY Left 02/22/2015   Dr. Fay Records  . MOUTH SURGERY    . VASECTOMY  11/01/2012    Family History        Family Status  Relation Status  . Mother Alive  . Father Alive  . MGM Deceased        His family history includes Dementia in his maternal grandmother.     Allergies  Allergen Reactions  . Antihistamines, Diphenhydramine-Type   . Decongestant [Oxymetazoline]   . Milk-Related Compounds   . Sudafed [Pseudoephedrine Hcl]      Current  Outpatient Prescriptions:  .  aspirin 81 MG tablet, Take 81 mg by mouth as needed for pain., Disp: , Rfl:  .  esomeprazole (NEXIUM) 40 MG capsule, Take 40 mg by mouth daily at 12 noon., Disp: , Rfl:    Patient Care Team: Malva Limes, MD as PCP - General (Family Medicine)      Objective:   Vitals: BP 128/82 (BP Location: Right Arm, Patient Position: Sitting, Cuff Size: Normal)   Temp 98.1 F (36.7 C)   Resp 16   Ht 5\' 11"  (1.803 m)   Wt 191 lb (86.6 kg)   BMI 26.64 kg/m    Vitals:   03/25/17 0910  BP: 128/82  Resp: 16  Temp: 98.1 F (36.7 C)  Weight: 191 lb (86.6 kg)  Height: 5\' 11"  (1.803 m)     Physical Exam   General Appearance:    Alert, cooperative, no  distress, appears stated age  Head:    Normocephalic, without obvious abnormality, atraumatic  Eyes:    PERRL, conjunctiva/corneas clear, EOM's intact, fundi    benign, both eyes       Ears:    Normal TM's and external ear canals, both ears  Nose:   Nares normal, septum midline, mucosa normal, no drainage   or sinus tenderness  Throat:   Lips, mucosa, and tongue normal; teeth and gums normal  Neck:   Supple, symmetrical, trachea midline, no adenopathy;       thyroid:  No enlargement/tenderness/nodules; no carotid   bruit or JVD  Back:     Symmetric, no curvature, ROM normal, no CVA tenderness  Lungs:     Clear to auscultation bilaterally, respirations unlabored  Chest wall:    No tenderness or deformity  Heart:    Regular rate and rhythm, S1 and S2 normal, no murmur, rub   or gallop  Abdomen:     Soft, non-tender, bowel sounds active all four quadrants,    no masses, no organomegaly  Genitalia:    deferred  Extremities:   Extremities normal, atraumatic, no cyanosis or edema  Pulses:   2+ and symmetric all extremities  Skin:   Skin color, texture, turgor normal, no rashes or lesions  Lymph nodes:   Cervical, supraclavicular, and axillary nodes normal  Neurologic:   CNII-XII intact. Normal strength, sensation and reflexes      throughout    Depression Screen PHQ 2/9 Scores 03/25/2017  PHQ - 2 Score 0      Assessment & Plan:     Routine Health Maintenance and Physical Exam  Exercise Activities and Dietary recommendations Goals    None      Immunization History  Administered Date(s) Administered  . Tdap 06/19/2008    Health Maintenance  Topic Date Due  . HIV Screening  01/13/1983  . INFLUENZA VACCINE  08/30/2017 (Originally 12/31/2016)  . TETANUS/TDAP  06/19/2018     Discussed health benefits of physical activity, and encouraged him to engage in regular exercise appropriate for his age and condition.      --------------------------------------------------------------------  1. Annual physical exam Counseled to limit alcohol beverages to no more than 2 a day, start exercising regularly. He reports he has already had flu vaccine.  - Lipid panel - COMPLETE METABOLIC PANEL WITH GFR  2. Hypercholesterolemia Check lipids.   3. Hemorrhoids, unspecified hemorrhoid type He is interested in having hemorrhoid removed. He is also interested in colonoscopy, although he is not aware of any specific risk factors. Counseled that colonoscopy  is recommended at age 49 for average risk adults.     Mila Merryonald Tigerlily Christine, MD  [facility]

## 2017-03-25 NOTE — Patient Instructions (Addendum)
Please contact your eyecare professional to schedule a routine eye exam   Limit alcohol consumption to no more than 2 servings per day.   It is recommended to engage in 150 minutes of moderate exercise every week.     Preventive Care 40-64 Years, Male Preventive care refers to lifestyle choices and visits with your health care provider that can promote health and wellness. What does preventive care include?  A yearly physical exam. This is also called an annual well check.  Dental exams once or twice a year.  Routine eye exams. Ask your health care provider how often you should have your eyes checked.  Personal lifestyle choices, including: ? Daily care of your teeth and gums. ? Regular physical activity. ? Eating a healthy diet. ? Avoiding tobacco and drug use. ? Limiting alcohol use. ? Practicing safe sex. ? Taking low-dose aspirin every day starting at age 37. What happens during an annual well check? The services and screenings done by your health care provider during your annual well check will depend on your age, overall health, lifestyle risk factors, and family history of disease. Counseling Your health care provider may ask you questions about your:  Alcohol use.  Tobacco use.  Drug use.  Emotional well-being.  Home and relationship well-being.  Sexual activity.  Eating habits.  Work and work Statistician.  Screening You may have the following tests or measurements:  Height, weight, and BMI.  Blood pressure.  Lipid and cholesterol levels. These may be checked every 5 years, or more frequently if you are over 61 years old.  Skin check.  Lung cancer screening. You may have this screening every year starting at age 22 if you have a 30-pack-year history of smoking and currently smoke or have quit within the past 15 years.  Fecal occult blood test (FOBT) of the stool. You may have this test every year starting at age 62.  Flexible sigmoidoscopy or  colonoscopy. You may have a sigmoidoscopy every 5 years or a colonoscopy every 10 years starting at age 107.  Prostate cancer screening. Recommendations will vary depending on your family history and other risks.  Hepatitis C blood test.  Hepatitis B blood test.  Sexually transmitted disease (STD) testing.  Diabetes screening. This is done by checking your blood sugar (glucose) after you have not eaten for a while (fasting). You may have this done every 1-3 years.  Discuss your test results, treatment options, and if necessary, the need for more tests with your health care provider. Vaccines Your health care provider may recommend certain vaccines, such as:  Influenza vaccine. This is recommended every year.  Tetanus, diphtheria, and acellular pertussis (Tdap, Td) vaccine. You may need a Td booster every 10 years.  Varicella vaccine. You may need this if you have not been vaccinated.  Zoster vaccine. You may need this after age 65.  Measles, mumps, and rubella (MMR) vaccine. You may need at least one dose of MMR if you were born in 1957 or later. You may also need a second dose.  Pneumococcal 13-valent conjugate (PCV13) vaccine. You may need this if you have certain conditions and have not been vaccinated.  Pneumococcal polysaccharide (PPSV23) vaccine. You may need one or two doses if you smoke cigarettes or if you have certain conditions.  Meningococcal vaccine. You may need this if you have certain conditions.  Hepatitis A vaccine. You may need this if you have certain conditions or if you travel or work in places  where you may be exposed to hepatitis A.  Hepatitis B vaccine. You may need this if you have certain conditions or if you travel or work in places where you may be exposed to hepatitis B.  Haemophilus influenzae type b (Hib) vaccine. You may need this if you have certain risk factors.  Talk to your health care provider about which screenings and vaccines you need and  how often you need them. This information is not intended to replace advice given to you by your health care provider. Make sure you discuss any questions you have with your health care provider. Document Released: 06/15/2015 Document Revised: 02/06/2016 Document Reviewed: 03/20/2015 Elsevier Interactive Patient Education  2017 [employer].

## 2017-03-31 ENCOUNTER — Encounter: Payer: Self-pay | Admitting: Family Medicine

## 2017-04-01 ENCOUNTER — Encounter: Payer: Self-pay | Admitting: Gastroenterology

## 2017-05-31 ENCOUNTER — Ambulatory Visit
Admission: EM | Admit: 2017-05-31 | Discharge: 2017-05-31 | Disposition: A | Discharge status: Home / Self Care | Payer: BC Managed Care – PPO | Attending: Family Medicine | Admitting: Family Medicine

## 2017-05-31 ENCOUNTER — Other Ambulatory Visit: Payer: Self-pay

## 2017-05-31 DIAGNOSIS — R05 Cough: Secondary | ICD-10-CM

## 2017-05-31 DIAGNOSIS — R059 Cough, unspecified: Secondary | ICD-10-CM

## 2017-05-31 DIAGNOSIS — J069 Acute upper respiratory infection, unspecified: Secondary | ICD-10-CM

## 2017-05-31 DIAGNOSIS — J9801 Acute bronchospasm: Secondary | ICD-10-CM

## 2017-05-31 MED ORDER — PREDNISONE 20 MG PO TABS: ORAL_TABLET | ORAL | 0 refills | Status: DC

## 2017-05-31 MED ORDER — OPTICHAMBER DIAMOND MISC
1.0000 | Freq: Once | Status: AC
  Administered 2017-05-31: 1

## 2017-05-31 MED ORDER — ALBUTEROL SULFATE HFA 108 (90 BASE) MCG/ACT IN AERS: 2.0000 | INHALATION_SPRAY | RESPIRATORY_TRACT | 0 refills | Status: DC | PRN

## 2017-05-31 MED ORDER — DOXYCYCLINE HYCLATE 100 MG PO CAPS: 100.0000 mg | ORAL_CAPSULE | Freq: Two times a day (BID) | ORAL | 0 refills | Status: DC

## 2017-05-31 MED ORDER — GUAIFENESIN-CODEINE 100-10 MG/5ML PO SOLN: 5.0000 mL | Freq: Every evening | ORAL | 0 refills | Status: DC | PRN

## 2017-05-31 NOTE — Discharge Instructions (Signed)
Take medication as prescribed. Rest. Drink plenty of fluids.  ° °Follow up with your primary care physician this week as needed. Return to Urgent care for new or worsening concerns.  ° °

## 2017-05-31 NOTE — ED Triage Notes (Signed)
Patient c/o cough and congestion x 8 days. Patient was seen at an urgent care in [place] on Wednesday and was given prednisone 50 mg for 5 days and benzonatate 100 mg with no relief.Patient is due to take last dose of prednisone today.

## 2017-05-31 NOTE — ED Provider Notes (Signed)
[facility] ____________________________________________  Time seen: Approximately 1:36 PM  I have reviewed the triage vital signs and the nursing notes.   HISTORY  Chief Complaint Cough   HPI Joel Leon is a 51 y.o. male presenting with wife at bedside for evaluation of 1.5 weeks of cough, chest congestion and nasal congestion.  Reports some throat irritation from cough.  States that initial sickness onset, he reports subjective fever but reports no other fever since then.  States his biggest complaint is cough.  States cough comes and intermittent fits.  Reports not sleeping as well due to the cough.  States does have some postnasal drainage.  States throat irritation but not much of a sore throat.  Reports overall continues to eat and drink well.  States has shortness of breath during the coughing fits, but denies other shortness of breath.  Denies chest pain.  States was seen at an urgent care this past week for the same complaints and was put on Tessalon Perles and prednisone but reports symptoms have continued.  Denies other aggravating or alleviating factors.  Reports recently around a few family members with some similar complaints. Denies hemoptysis, dysuria, extremity pain, extremity swelling or rash. Denies recent sickness. Denies recent antibiotic use.   Malva LimesFisher, Donald E, MD: PCP   Past Medical History:  Diagnosis Date  . Allergy   . GERD (gastroesophageal reflux disease)     Patient Active Problem List   Diagnosis Date Noted  . Acid reflux 03/24/2017  . Hypercholesterolemia 03/24/2017  . 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 Hernia of abdominal cavity     Past Surgical History:  Procedure Laterality Date  . INGUINAL HERNIA REPAIR N/A 01/23/2015   Procedure: Laparoscopic right inguinal hernia repair ;  Surgeon: Natale LayMark Bird, MD;  Location: [facility] ORS;  Service: General;  Laterality: N/A;  . KNEE ARTHROSCOPY W/ PARTIAL MEDIAL MENISCECTOMY Left  02/22/2015   Dr. Fay Recordsellaero  . MOUTH SURGERY    . VASECTOMY  11/01/2012     No current facility-administered medications for this encounter.   Current Outpatient Medications:  .  esomeprazole (NEXIUM) 40 MG capsule, Take 40 mg by mouth daily at 12 noon., Disp: , Rfl:  .  ibuprofen (ADVIL,MOTRIN) 200 MG tablet, Take 200 mg by mouth every 6 (six) hours as needed., Disp: , Rfl:  .  albuterol (PROVENTIL HFA;VENTOLIN HFA) 108 (90 Base) MCG/ACT inhaler, Inhale 2 puffs into the lungs every 4 (four) hours as needed., Disp: 1 Inhaler, Rfl: 0 .  aspirin 81 MG tablet, Take 81 mg by mouth as needed for pain., Disp: , Rfl:  .  doxycycline (VIBRAMYCIN) 100 MG capsule, Take 1 capsule (100 mg total) by mouth 2 (two) times daily., Disp: 20 capsule, Rfl: 0 .  guaiFENesin-codeine 100-10 MG/5ML syrup, Take 5 mLs by mouth at bedtime as needed for cough. Do not drive or operate machinery as can cause drowsiness., Disp: 75 mL, Rfl: 0 .  predniSONE (DELTASONE) 20 MG tablet, Take 40 mg orally day 1, 2 and 3, then 20 mg orally daily for days 3 and 4., Disp: 8 tablet, Rfl: 0  Allergies Antihistamines, diphenhydramine-type; Decongestant [oxymetazoline]; Milk-related compounds; and Sudafed [pseudoephedrine hcl]  Family History  Problem Relation Age of Onset  . Dementia Maternal Grandmother     Social History Social History   Tobacco Use  . Smoking status: Never Smoker  . Smokeless tobacco: Never Used  Substance Use Topics  . Alcohol use: Yes    Alcohol/week: 0.0  oz    Comment: 2-3 DRINKS PER DAY  . Drug use: No    Review of Systems Constitutional:As above.  Eyes: No visual changes. ENT: As above.  Cardiovascular: Denies chest pain. Respiratory: As above.  Gastrointestinal: No abdominal pain.  No nausea, no vomiting.  No diarrhea.  No constipation. Musculoskeletal: Negative for back pain. Skin: Negative for rash.   ____________________________________________   PHYSICAL EXAM:  VITAL SIGNS: ED  Triage Vitals  Enc Vitals Group     BP 05/31/17 1310 138/90     Pulse Rate 05/31/17 1310 68     Resp -- 18     Temp 05/31/17 1310 98 F (36.7 C)     Temp Source 05/31/17 1310 Oral     SpO2 05/31/17 1310 96 %     Weight 05/31/17 1310 194 lb (88 kg)     Height 05/31/17 1310 5\' 11"  (1.803 m)     Head Circumference --      Peak Flow --      Pain Score 05/31/17 1353 0     Pain Loc --      Pain Edu? --      Excl. in GC? --     Constitutional: Alert and oriented. Well appearing and in no acute distress. Eyes: Conjunctivae are normal.  Head: Atraumatic. No sinus tenderness to palpation. No swelling. No erythema.  Ears: no erythema, normal TMs bilaterally.   Nose:Nasal congestion  Mouth/Throat: Mucous membranes are moist. No pharyngeal erythema. No tonsillar swelling or exudate.  Neck: No stridor.  No cervical spine tenderness to palpation. Hematological/Lymphatic/Immunilogical: No cervical lymphadenopathy. Cardiovascular: Normal rate, regular rhythm. Grossly normal heart sounds.  Good peripheral circulation. Respiratory: Normal respiratory effort.  No retractions. No wheezes, rales or rhonchi. Good air movement.  Dry intermittent cough noted in room with bronchospasm. Musculoskeletal: Ambulatory with steady gait. No cervical, thoracic or lumbar tenderness to palpation. Neurologic:  Normal speech and language. No gait instability. Skin:  Skin appears warm, dry and intact. No rash noted. Psychiatric: Mood and affect are normal. Speech and behavior are normal.  ___________________________________________   LABS (all labs ordered are listed, but only abnormal results are displayed)  Labs Reviewed - No data to display   PROCEDURES Procedures   INITIAL IMPRESSION / ASSESSMENT AND PLAN / ED COURSE  Pertinent labs & imaging results that were available during my care of the patient were reviewed by me and considered in my medical decision making (see chart for  details).  Well-appearing patient.  No acute distress.  Suspect recent viral upper respiratory infection.  However patient with cough and bronchospasm.  Symptoms have continued.  Will treat patient with guaifenesin with codeine, new prednisone dose, as needed albuterol inhaler and oral doxycycline.  Encourage rest, fluids, supportive care.  Discussed follow-up and return parameters.  Discussed for continuing complaints, reevaluation and likely chest x-ray at that time.  Defer chest x-ray at this time, patient agrees.Discussed indication, risks and benefits of medications with patient.  Discussed follow up with Primary care physician this week. Discussed follow up and return parameters including no resolution or any worsening concerns. Patient verbalized understanding and agreed to plan.   ____________________________________________   FINAL CLINICAL IMPRESSION(S) / ED DIAGNOSES  Final diagnoses:  Upper respiratory tract infection, unspecified type  Cough  Bronchospasm     ED Discharge Orders        Ordered    predniSONE (DELTASONE) 20 MG tablet     05/31/17 1349    albuterol (  PROVENTIL HFA;VENTOLIN HFA) 108 (90 Base) MCG/ACT inhaler  Every 4 hours PRN     05/31/17 1349    guaiFENesin-codeine 100-10 MG/5ML syrup  At bedtime PRN     05/31/17 1349    doxycycline (VIBRAMYCIN) 100 MG capsule  2 times daily     05/31/17 1349       Note: This dictation was prepared with Dragon dictation along with smaller phrase technology. Any transcriptional errors that result from this process are unintentional.         Renford Dills, NP 05/31/17 1443

## 2017-06-03 ENCOUNTER — Other Ambulatory Visit: Payer: Self-pay

## 2017-06-03 DIAGNOSIS — Z8619 Personal history of other infectious and parasitic diseases: Secondary | ICD-10-CM | POA: Insufficient documentation

## 2017-06-04 ENCOUNTER — Encounter: Payer: Self-pay | Admitting: Gastroenterology

## 2017-06-04 ENCOUNTER — Ambulatory Visit: Payer: BC Managed Care – PPO | Admitting: Gastroenterology

## 2017-06-04 ENCOUNTER — Other Ambulatory Visit: Payer: Self-pay

## 2017-06-04 VITALS — BP 151/92 | HR 76 | Temp 97.9°F | Ht 71.0 in | Wt 195.2 lb

## 2017-06-04 DIAGNOSIS — K6289 Other specified diseases of anus and rectum: Secondary | ICD-10-CM

## 2017-06-04 DIAGNOSIS — K641 Second degree hemorrhoids: Secondary | ICD-10-CM | POA: Diagnosis not present

## 2017-06-04 DIAGNOSIS — K649 Unspecified hemorrhoids: Secondary | ICD-10-CM

## 2017-06-04 NOTE — Addendum Note (Signed)
Addended by: Avie ArenasTEGA, Glema Takaki S on: 06/04/2017 04:56 PM   Modules accepted: Orders, SmartSet

## 2017-06-04 NOTE — Progress Notes (Signed)
Joel Repress, MD [address]  [address]  [place], [place] [address]  Main: [phone]  Fax: [phone]    Gastroenterology Consultation  Referring Provider:     Malva Limes, MD Primary Care Physician:  Joel Limes, MD Primary Gastroenterologist:  Dr. Arlyss Leon Reason for Consultation:     For colonoscopy and evaluation of hemorrhoids        HPI:   Joel Leon is a 52 y.o. male referred by Dr. Malva Limes, MD  for consultation & management of prolapse of hemorrhoids. He has history of GERD controlled on Nexium daily. He is recovering from upper respiratory tract illness. 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 He is otherwise asymptomatic. He reports having regular bowel movements, denies rectal bleeding or any symptoms related to hemorrhoids. He is also requesting colonoscopy as he is approaching 52 years. He denies any family history of colon cancer. He had inguinal hernia repair.  NSAIDs: None  Antiplts/Anticoagulants/Anti thrombotics: None  GI Procedures: None  He drinks 2-3 glasses of wine most of the descending week He is an attorney, spends most of the time sitting which aggravates his hemorrhoids  Past Medical History:  Diagnosis Date  . Allergy   . GERD (gastroesophageal reflux disease)     Past Surgical History:  Procedure Laterality Date  . INGUINAL HERNIA REPAIR N/A 01/23/2015   Procedure: Laparoscopic right inguinal hernia repair ;  Surgeon: Joel Lay, MD;  Location: [facility] ORS;  Service: General;  Laterality: N/A;  . KNEE ARTHROSCOPY W/ PARTIAL MEDIAL MENISCECTOMY Left 02/22/2015   Dr. Fay Leon  . MOUTH SURGERY    . VASECTOMY  11/01/2012    Prior to Admission medications   Medication Sig Start Date End Date Taking? Authorizing Provider  albuterol (PROVENTIL HFA;VENTOLIN HFA) 108 (90 Base) MCG/ACT inhaler Inhale 2  puffs into the lungs every 4 (four) hours as needed. 05/31/17   Joel Dills, NP  aspirin 81 MG tablet Take 81 mg by mouth as needed for pain.    [provider]  benzonatate (TESSALON) 100 MG capsule TAKE 1 CAPSULE BY MOUTH 3 TIMES A DAY AS NEEDED FOR COUGH X10 DAYS 05/27/17   [provider]  doxycycline (VIBRAMYCIN) 100 MG capsule Take 1 capsule (100 mg total) by mouth 2 (two) times daily. 05/31/17   Joel Dills, NP  esomeprazole (NEXIUM 24HR) 20 MG capsule Take by mouth.    [provider]  esomeprazole (NEXIUM) 40 MG capsule Take 40 mg by mouth daily at 12 noon.    [provider]  guaiFENesin-codeine 100-10 MG/5ML syrup Take 5 mLs by mouth at bedtime as needed for cough. Do not drive or operate machinery as can cause drowsiness. 05/31/17   Joel Dills, NP  ibuprofen (ADVIL,MOTRIN) 200 MG tablet Take 200 mg by mouth every 6 (six) hours as needed.    [provider]  naproxen (NAPROSYN) 500 MG tablet naproxen 500 mg tablet  TAKE 1 TABLET BY MOUTH 2 TIMES A DAY 08/23/14   [provider]  oxyCODONE (OXY IR/ROXICODONE) 5 MG immediate release tablet oxycodone 5 mg tablet  Take 1-3 tablet(s) EVERY 3-6 HOURS by oral route as needed for pain    [provider]  predniSONE (DELTASONE) 20 MG tablet Take 40 mg orally day 1, 2 and 3, then 20 mg orally daily for days 3 and 4.  05/31/17   Joel DillsMiller, Lindsey, NP  predniSONE (DELTASONE) 50 MG tablet TAKE 1 TABLET BY MOUTH EVERY DAY FOR 5 DAYS 05/27/17   [provider]  promethazine (PHENERGAN) 25 MG tablet promethazine 25 mg tablet  Take 1 tablet every 6 hours as needed for nausea    [provider]    Family History  Problem Relation Age of Onset  . Dementia Maternal Grandmother      Social History   Tobacco Use  . Smoking status: Never Smoker  . Smokeless tobacco: Never Used  Substance Use Topics  . Alcohol use: Yes    Alcohol/week: 0.0 oz    Comment: 2-3  DRINKS PER DAY  . Drug use: No    Allergies as of 06/04/2017 - Review Complete 05/31/2017  Allergen Reaction Noted  . Aller-chlor  [chlorpheniramine maleate] Rash   . Antihistamines, diphenhydramine-type Rash 01/03/2015  . Antihistamines, chlorpheniramine-type  04/07/2007  . Decongestant [oxymetazoline]  01/03/2015  . Lactose    . Milk protein  06/19/2008  . Milk-related compounds  01/03/2015  . Sudafed [pseudoephedrine hcl]  01/03/2015  . Brompheniramine Rash 10/04/2012    Review of Systems:    All systems reviewed and negative except where noted in HPI.   Physical Exam:  There were no vitals taken for this visit. No LMP for male patient.  General:   Alert,  Well-developed, well-nourished, pleasant and cooperative in NAD Head:  Normocephalic and atraumatic. Eyes:  Sclera clear, no icterus.   Conjunctiva pink. Ears:  Normal auditory acuity. Nose:  No deformity, discharge, or lesions. Mouth:  No deformity or lesions,oropharynx pink & moist. Neck:  Supple; no masses or thyromegaly. Lungs:  Respirations even and unlabored.  Expiratory wheeze to auscultation in bilateral posterior lung fields. No acute distress. Heart:  Regular rate and rhythm; no murmurs, clicks, rubs, or gallops. Abdomen:  Normal bowel sounds. Soft, non-tender and non-distended without masses, hepatosplenomegaly or hernias noted.  No guarding or rebound tenderness.   Rectal: Not performed, perianal exam revealed small external hemorrhoid, nontender Msk:  Symmetrical without gross deformities. Good, equal movement & strength bilaterally. Pulses:  Normal pulses noted. Extremities:  No clubbing or edema.  No cyanosis. Neurologic:  Alert and oriented x3;  grossly normal neurologically. Skin:  Intact without significant lesions or rashes. No jaundice. Lymph Nodes:  No significant cervical adenopathy. Psych:  Alert and cooperative. Normal mood and affect.  Imaging Studies: Reviewed  Assessment and Plan:   Joel Bertholdaul  L Leon is a 52 y.o. male with history of GERD, controlled on PPI here to discuss about colonoscopy and perianal lesion. The palpable lesion is external hemorrhoid. I also discussed with him about colonoscopy as he is interested in pursuing it now rather than waiting until he turns 52 years  I have discussed alternative options, risks & benefits,  which include, but are not limited to, bleeding, infection, perforation,respiratory complication & drug reaction.  The patient agrees with this plan & written consent will be obtained. I have also discussed with him about hemorrhoid banding after colonoscopy    Follow up after the colonoscopy   Joel Repressohini R Sandrika Schwinn, MD

## 2017-06-10 ENCOUNTER — Telehealth: Payer: Self-pay

## 2017-06-10 NOTE — Telephone Encounter (Signed)
Gastroenterology Pre-Procedure Review  Request Date: 08/11/17  Requesting Physician: Dr. Allegra LaiVanga  PATIENT REVIEW QUESTIONS: The patient responded to the following health history questions as indicated:    1. Are you having any GI issues? yes Genella Rife(Gerd) 2. Do you have a personal history of Polyps? no 3. Do you have a family history of Colon Cancer or Polyps? no 4. Diabetes Mellitus? no 5. Joint replacements in the past 12 months?no 6. Major health problems in the past 3 months?no 7. Any artificial heart valves, MVP, or defibrillator?no    MEDICATIONS & ALLERGIES:    Patient reports the following regarding taking any anticoagulation/antiplatelet therapy:   Plavix, Coumadin, Eliquis, Xarelto, Lovenox, Pradaxa, Brilinta, or Effient? no Aspirin? no  Patient confirms/reports the following medications:  Current Outpatient Medications  Medication Sig Dispense Refill  . albuterol (PROVENTIL HFA;VENTOLIN HFA) 108 (90 Base) MCG/ACT inhaler Inhale 2 puffs into the lungs every 4 (four) hours as needed. 1 Inhaler 0  . doxycycline (VIBRAMYCIN) 100 MG capsule Take 1 capsule (100 mg total) by mouth 2 (two) times daily. 20 capsule 0  . esomeprazole (NEXIUM 24HR) 20 MG capsule Take by mouth.    . esomeprazole (NEXIUM) 40 MG capsule Take 40 mg by mouth daily at 12 noon.    Marland Kitchen. ibuprofen (ADVIL,MOTRIN) 200 MG tablet Take 200 mg by mouth every 6 (six) hours as needed.    . naproxen (NAPROSYN) 500 MG tablet naproxen 500 mg tablet  TAKE 1 TABLET BY MOUTH 2 TIMES A DAY    . predniSONE (DELTASONE) 20 MG tablet Take 40 mg orally day 1, 2 and 3, then 20 mg orally daily for days 3 and 4. 8 tablet 0  . predniSONE (DELTASONE) 50 MG tablet TAKE 1 TABLET BY MOUTH EVERY DAY FOR 5 DAYS  0   No current facility-administered medications for this visit.     Patient confirms/reports the following allergies:  Allergies  Allergen Reactions  . Aller-Chlor  [Chlorpheniramine Maleate] Rash  . Antihistamines,  Diphenhydramine-Type Rash  . Antihistamines, Chlorpheniramine-Type     Andria MeuseStevens- Johnsons' syndrome  . Decongestant [Oxymetazoline]   . Lactose   . Milk Protein   . Milk-Related Compounds   . Sudafed [Pseudoephedrine Hcl]   . Brompheniramine Rash    burns    No orders of the defined types were placed in this encounter.   AUTHORIZATION INFORMATION Primary Insurance: 1D#: Group #:  Secondary Insurance: 1D#: Group #:  SCHEDULE INFORMATION: Date: 08/11/2017   Time: Location: [facility]

## 2017-08-11 ENCOUNTER — Encounter
Admission: RE | Disposition: A | Discharge status: Home / Self Care | Payer: Self-pay | Source: Ambulatory Visit | Attending: Gastroenterology

## 2017-08-11 ENCOUNTER — Ambulatory Visit: Payer: BC Managed Care – PPO | Admitting: Certified Registered Nurse Anesthetist

## 2017-08-11 ENCOUNTER — Ambulatory Visit
Admission: RE | Admit: 2017-08-11 | Discharge: 2017-08-11 | Disposition: A | Discharge status: Home / Self Care | Payer: BC Managed Care – PPO | Source: Ambulatory Visit | Attending: Gastroenterology | Admitting: Gastroenterology

## 2017-08-11 ENCOUNTER — Encounter: Payer: Self-pay | Admitting: Anesthesiology

## 2017-08-11 DIAGNOSIS — Z1211 Encounter for screening for malignant neoplasm of colon: Secondary | ICD-10-CM | POA: Diagnosis present

## 2017-08-11 DIAGNOSIS — K219 Gastro-esophageal reflux disease without esophagitis: Secondary | ICD-10-CM | POA: Diagnosis not present

## 2017-08-11 DIAGNOSIS — K641 Second degree hemorrhoids: Secondary | ICD-10-CM

## 2017-08-11 DIAGNOSIS — K6289 Other specified diseases of anus and rectum: Secondary | ICD-10-CM

## 2017-08-11 DIAGNOSIS — K648 Other hemorrhoids: Secondary | ICD-10-CM | POA: Insufficient documentation

## 2017-08-11 DIAGNOSIS — Z79899 Other long term (current) drug therapy: Secondary | ICD-10-CM | POA: Diagnosis not present

## 2017-08-11 DIAGNOSIS — K621 Rectal polyp: Secondary | ICD-10-CM | POA: Insufficient documentation

## 2017-08-11 DIAGNOSIS — K644 Residual hemorrhoidal skin tags: Secondary | ICD-10-CM | POA: Diagnosis not present

## 2017-08-11 DIAGNOSIS — K5730 Diverticulosis of large intestine without perforation or abscess without bleeding: Secondary | ICD-10-CM | POA: Insufficient documentation

## 2017-08-11 HISTORY — PX: COLONOSCOPY WITH PROPOFOL: SHX5780

## 2017-08-11 SURGERY — COLONOSCOPY WITH PROPOFOL
Anesthesia: General

## 2017-08-11 MED ORDER — PROPOFOL 10 MG/ML IV BOLUS
INTRAVENOUS | Status: DC | PRN
  Administered 2017-08-11: 80 mg via INTRAVENOUS

## 2017-08-11 MED ORDER — MIDAZOLAM HCL 2 MG/2ML IJ SOLN
INTRAMUSCULAR | Status: AC
  Filled 2017-08-11: qty 2

## 2017-08-11 MED ORDER — SODIUM CHLORIDE 0.9 % IV SOLN
INTRAVENOUS | Status: DC
  Administered 2017-08-11: 1000 mL via INTRAVENOUS

## 2017-08-11 MED ORDER — PROPOFOL 500 MG/50ML IV EMUL
INTRAVENOUS | Status: AC
  Filled 2017-08-11: qty 50

## 2017-08-11 MED ORDER — PROPOFOL 500 MG/50ML IV EMUL
INTRAVENOUS | Status: DC | PRN
  Administered 2017-08-11: 120 ug/kg/min via INTRAVENOUS

## 2017-08-11 MED ORDER — MIDAZOLAM HCL 2 MG/2ML IJ SOLN
INTRAMUSCULAR | Status: DC | PRN
  Administered 2017-08-11: 1 mg via INTRAVENOUS

## 2017-08-11 NOTE — H&P (Addendum)
Arlyss Repress, MD [address]  [address]  [place], [place] [address]  Main: [phone]  Fax: [phone] Pager: [phone]  Primary Care Physician:  Malva Limes, MD Primary Gastroenterologist:  Dr. Arlyss Repress  Pre-Procedure History & Physical: HPI:  Joel Leon is a 52 y.o. male is here for an colonoscopy.   Past Medical History:  Diagnosis Date  . Allergy   . GERD (gastroesophageal reflux disease)     Past Surgical History:  Procedure Laterality Date  . INGUINAL HERNIA REPAIR N/A 01/23/2015   Procedure: Laparoscopic right inguinal hernia repair ;  Surgeon: Natale Lay, MD;  Location: [facility] ORS;  Service: General;  Laterality: N/A;  . KNEE ARTHROSCOPY W/ PARTIAL MEDIAL MENISCECTOMY Left 02/22/2015   Dr. Fay Records  . MOUTH SURGERY    . VASECTOMY  11/01/2012    Prior to Admission medications   Medication Sig Start Date End Date Taking? Authorizing Provider  esomeprazole (NEXIUM 24HR) 20 MG capsule Take by mouth.   Yes [provider]  esomeprazole (NEXIUM) 40 MG capsule Take 40 mg by mouth daily at 12 noon.   Yes [provider]  ibuprofen (ADVIL,MOTRIN) 200 MG tablet Take 200 mg by mouth every 6 (six) hours as needed.   Yes [provider]  naproxen (NAPROSYN) 500 MG tablet naproxen 500 mg tablet  TAKE 1 TABLET BY MOUTH 2 TIMES A DAY 08/23/14  Yes [provider]  predniSONE (DELTASONE) 20 MG tablet Take 40 mg orally day 1, 2 and 3, then 20 mg orally daily for days 3 and 4. 05/31/17  Yes Renford Dills, NP  predniSONE (DELTASONE) 50 MG tablet TAKE 1 TABLET BY MOUTH EVERY DAY FOR 5 DAYS 05/27/17  Yes [provider]  albuterol (PROVENTIL HFA;VENTOLIN HFA) 108 (90 Base) MCG/ACT inhaler Inhale 2 puffs into the lungs every 4 (four) hours as needed. Patient not taking: Reported on 08/11/2017 05/31/17   Renford Dills, NP  doxycycline (VIBRAMYCIN) 100 MG capsule Take 1 capsule (100 mg total) by mouth 2 (two) times  daily. Patient not taking: Reported on 08/11/2017 05/31/17   Renford Dills, NP    Allergies as of 06/05/2017 - Review Complete 06/04/2017  Allergen Reaction Noted  . Aller-chlor  [chlorpheniramine maleate] Rash   . Antihistamines, diphenhydramine-type Rash 01/03/2015  . Antihistamines, chlorpheniramine-type  04/07/2007  . Decongestant [oxymetazoline]  01/03/2015  . Lactose    . Milk protein  06/19/2008  . Milk-related compounds  01/03/2015  . Sudafed [pseudoephedrine hcl]  01/03/2015  . Brompheniramine Rash 10/04/2012    Family History  Problem Relation Age of Onset  . Dementia Maternal Grandmother     Social History   Socioeconomic History  . Marital status: Married    Spouse name: Not on file  . Number of children: Not on file  . Years of education: Not on file  . Highest education level: Not on file  Social Needs  . Financial resource strain: Not on file  . Food insecurity - worry: Not on file  . Food insecurity - inability: Not on file  . Transportation needs - medical: Not on file  . Transportation needs - non-medical: Not on file  Occupational History  . Not on file  Tobacco Use  . Smoking status: Never Smoker  . Smokeless tobacco: Never Used  Substance and Sexual Activity  . Alcohol use: Yes    Alcohol/week: 0.0 oz    Comment: 2-3 DRINKS PER DAY  . Drug use: No  .  Sexual activity: Not on file  Other Topics Concern  . Not on file  Social History Narrative  . Not on file    Review of Systems: See HPI, otherwise negative ROS  Physical Exam: BP (!) 152/101   Temp (!) 96.5 F (35.8 C)   Resp 20   Ht 5\' 11"  (1.803 m)   Wt 184 lb (83.5 kg)   SpO2 99%   BMI 25.66 kg/m  General:   Alert,  pleasant and cooperative in NAD Head:  Normocephalic and atraumatic. Neck:  Supple; no masses or thyromegaly. Lungs:  Clear throughout to auscultation.    Heart:  Regular rate and rhythm. Abdomen:  Soft, nontender and nondistended. Normal bowel sounds, without  guarding, and without rebound.   Neurologic:  Alert and  oriented x4;  grossly normal neurologically.  Impression/Plan: Joel Leon is here for an colonoscopy to be performed for colon cancer screening  Risks, benefits, limitations, and alternatives regarding  colonoscopy have been reviewed with the patient.  Questions have been answered.  All parties agreeable.   Lannette Donathohini Oswin Griffith, MD  08/11/2017, 1:29 PM

## 2017-08-11 NOTE — Anesthesia Post-op Follow-up Note (Signed)
Anesthesia QCDR form completed.        

## 2017-08-11 NOTE — Anesthesia Postprocedure Evaluation (Signed)
Anesthesia Post Note  Patient: Joel Leon  Procedure(s) Performed: COLONOSCOPY WITH PROPOFOL (N/A )  Patient location during evaluation: Endoscopy Anesthesia Type: General Level of consciousness: awake and alert Pain management: pain level controlled Vital Signs Assessment: post-procedure vital signs reviewed and stable Respiratory status: spontaneous breathing, nonlabored ventilation, respiratory function stable and patient connected to nasal cannula oxygen Cardiovascular status: blood pressure returned to baseline and stable Postop Assessment: no apparent nausea or vomiting Anesthetic complications: no     Last Vitals:  Vitals:   08/11/17 1417 08/11/17 1427  BP: (!) 147/91 (!) 162/95  Pulse: 63 63  Resp: 17 18  Temp:    SpO2: 96% 97%    Last Pain:  Vitals:   08/11/17 1407  TempSrc: Tympanic                 Cleda MccreedyJoseph K Chavon Lucarelli

## 2017-08-11 NOTE — Op Note (Signed)
[facility] Gastroenterology Patient Name: Joel Leon Procedure Date: 08/11/2017 1:41 PM MRN: [id number] Account #: [id number] Date of Birth: 01-Jul-1965 Admit Type: Outpatient Age: 52 Room: [facility] ENDO ROOM 4 Gender: Male Note Status: Finalized Procedure:            Colonoscopy Indications:          Screening for colorectal malignant neoplasm, This is                        the patient's first colonoscopy Providers:            Lin Landsman MD, MD Referring MD:         Kirstie Peri. Caryn Section, MD (Referring MD) Medicines:            Monitored Anesthesia Care Complications:        No immediate complications. Estimated blood loss: None. Procedure:            Pre-Anesthesia Assessment:                       - Prior to the procedure, a History and Physical was                        performed, and patient medications and allergies were                        reviewed. The patient is competent. The risks and                        benefits of the procedure and the sedation options and                        risks were discussed with the patient. All questions                        were answered and informed consent was obtained.                        Patient identification and proposed procedure were                        verified by the physician, the nurse, the                        anesthesiologist, the anesthetist and the technician in                        the pre-procedure area in the procedure room in the                        endoscopy suite. Mental Status Examination: alert and                        oriented. Airway Examination: normal oropharyngeal                        airway and neck mobility. Respiratory Examination:                        clear to auscultation. CV Examination: normal.  Prophylactic Antibiotics: The patient does not require                        prophylactic antibiotics. Prior Anticoagulants: The          patient has taken no previous anticoagulant or                        antiplatelet agents. ASA Grade Assessment: II - A                        patient with mild systemic disease. After reviewing the                        risks and benefits, the patient was deemed in                        satisfactory condition to undergo the procedure. The                        anesthesia plan was to use monitored anesthesia care                        (MAC). Immediately prior to administration of                        medications, the patient was re-assessed for adequacy                        to receive sedatives. The heart rate, respiratory rate,                        oxygen saturations, blood pressure, adequacy of                        pulmonary ventilation, and response to care were                        monitored throughout the procedure. The physical status                        of the patient was re-assessed after the procedure.                       After obtaining informed consent, the colonoscope was                        passed under direct vision. Throughout the procedure,                        the patient's blood pressure, pulse, and oxygen                        saturations were monitored continuously. The                        Colonoscope was introduced through the anus and                        advanced to the the cecum, identified by appendiceal  orifice and ileocecal valve. The colonoscopy was                        performed without difficulty. The patient tolerated the                        procedure well. The quality of the bowel preparation                        was evaluated using the BBPS [facility] Bowel Preparation                        Scale) with scores of: Right Colon = 3, Transverse                        Colon = 3 and Left Colon = 3 (entire mucosa seen well                        with no residual staining, small fragments of stool or                         opaque liquid). The total BBPS score equals 9. Findings:      Many diverticula were found in the sigmoid colon. There was no evidence       of diverticular bleeding.      Two sessile polyps were found in the rectum. The polyps were 5 mm in       size. These polyps were removed with a cold snare. Resection and       retrieval were complete.      External and internal hemorrhoids were found during retroflexion. The       hemorrhoids were large.      The exam was otherwise without abnormality. Impression:           - Moderate diverticulosis in the sigmoid colon. There                        was no evidence of diverticular bleeding.                       - Two 5 mm polyps in the rectum, removed with a cold                        snare. Resected and retrieved.                       - External and internal hemorrhoids.                       - The examination was otherwise normal. Recommendation:       - Discharge patient to home.                       - Resume previous diet today.                       - Continue present medications.                       - Await pathology results.                       -  Repeat colonoscopy in 5-10 years for surveillance                        based on pathology results.                       - Return to my office as previously scheduled for                        ligation of hemorrhoids. Procedure Code(s):    --- Professional ---                       [id number], Colonoscopy, flexible; with removal of tumor(s),                        polyp(s), or other lesion(s) by snare technique Diagnosis Code(s):    --- Professional ---                       Z12.11, Encounter for screening for malignant neoplasm                        of colon                       K64.8, Other hemorrhoids                       K62.1, Rectal polyp                       K57.30, Diverticulosis of large intestine without                        perforation or abscess  without bleeding CPT copyright 2016 American Medical Association. All rights reserved. The codes documented in this report are preliminary and upon coder review may  be revised to meet current compliance requirements. Dr. Ulyess Mort Lin Landsman MD, MD 08/11/2017 2:03:44 PM This report has been signed electronically. Number of Addenda: 0 Note Initiated On: 08/11/2017 1:41 PM Scope Withdrawal Time: 0 hours 8 minutes 23 seconds  Total Procedure Duration: 0 hours 11 minutes 25 seconds       [facility]

## 2017-08-11 NOTE — Transfer of Care (Signed)
Immediate Anesthesia Transfer of Care Note  Patient: Joel Leon  Procedure(s) Performed: COLONOSCOPY WITH PROPOFOL (N/A )  Patient Location: PACU  Anesthesia Type:General  Level of Consciousness: drowsy  Airway & Oxygen Therapy: Patient Spontanous Breathing  Post-op Assessment: Report given to RN and Post -op Vital signs reviewed and stable  Post vital signs: Reviewed and stable  Last Vitals:  Vitals:   08/11/17 1304 08/11/17 1407  BP: (!) 152/101 122/85  Pulse:  73  Resp: 20 14  Temp: (!) 35.8 C (!) 36 C  SpO2: 99% 97%    Last Pain:  Vitals:   08/11/17 1407  TempSrc: Tympanic      Patients Stated Pain Goal: 0 (08/11/17 1304)  Complications: No apparent anesthesia complications

## 2017-08-11 NOTE — Anesthesia Preprocedure Evaluation (Signed)
Anesthesia Evaluation  Patient identified by MRN, date of birth, ID band Patient awake    Reviewed: Allergy & Precautions, H&P , NPO status , Patient's Chart, lab work & pertinent test results  History of Anesthesia Complications Negative for: history of anesthetic complications  Airway Mallampati: III  TM Distance: <3 FB Neck ROM: full    Dental  (+) Chipped, Poor Dentition, Caps   Pulmonary neg pulmonary ROS, neg shortness of breath,           Cardiovascular Exercise Tolerance: Good (-) angina(-) Past MI negative cardio ROS       Neuro/Psych negative neurological ROS  negative psych ROS   GI/Hepatic Neg liver ROS, GERD  Medicated and Controlled,  Endo/Other  negative endocrine ROS  Renal/GU negative Renal ROS  negative genitourinary   Musculoskeletal   Abdominal   Peds  Hematology negative hematology ROS (+)   Anesthesia Other Findings Past Medical History: No date: Allergy No date: GERD (gastroesophageal reflux disease)  Past Surgical History: 01/23/2015: INGUINAL HERNIA REPAIR; N/A     Comment:  Procedure: Laparoscopic right inguinal hernia repair ;                Surgeon: Natale LayMark Bird, MD;  Location: [facility] ORS;  Service:               General;  Laterality: N/A; 02/22/2015: KNEE ARTHROSCOPY W/ PARTIAL MEDIAL MENISCECTOMY; Left     Comment:  Dr. Fay Recordsellaero No date: MOUTH SURGERY 11/01/2012: VASECTOMY  BMI    Body Mass Index:  25.66 kg/m      Reproductive/Obstetrics negative OB ROS                             Anesthesia Physical Anesthesia Plan  ASA: II  Anesthesia Plan: General   Post-op Pain Management:    Induction: Intravenous  PONV Risk Score and Plan: Propofol infusion and TIVA  Airway Management Planned: Natural Airway and Nasal Cannula  Additional Equipment:   Intra-op Plan:   Post-operative Plan:   Informed Consent: I have reviewed the patients History and  Physical, chart, labs and discussed the procedure including the risks, benefits and alternatives for the proposed anesthesia with the patient or authorized representative who has indicated his/her understanding and acceptance.   Dental Advisory Given  Plan Discussed with: Anesthesiologist, CRNA and Surgeon  Anesthesia Plan Comments: (Patient consented for risks of anesthesia including but not limited to:  - adverse reactions to medications - risk of intubation if required - damage to teeth, lips or other oral mucosa - sore throat or hoarseness - Damage to heart, brain, lungs or loss of life  Patient voiced understanding.)        Anesthesia Quick Evaluation

## 2017-08-12 ENCOUNTER — Encounter: Payer: Self-pay | Admitting: Gastroenterology

## 2017-08-14 LAB — SURGICAL PATHOLOGY

## 2017-08-17 ENCOUNTER — Encounter: Payer: Self-pay | Admitting: Gastroenterology

## 2017-09-25 ENCOUNTER — Ambulatory Visit: Payer: BC Managed Care – PPO | Admitting: Gastroenterology

## 2017-09-25 ENCOUNTER — Encounter: Payer: Self-pay | Admitting: Gastroenterology

## 2017-09-25 VITALS — BP 145/95 | HR 62 | Ht 71.0 in | Wt 195.0 lb

## 2017-09-25 DIAGNOSIS — K640 First degree hemorrhoids: Secondary | ICD-10-CM | POA: Diagnosis not present

## 2017-09-25 NOTE — Progress Notes (Signed)
PROCEDURE NOTE: The patient presents with symptomatic grade 1 hemorrhoids, unresponsive to maximal medical therapy, requesting rubber band ligation of his/her hemorrhoidal disease.  All risks, benefits and alternative forms of therapy were described and informed consent was obtained.  The decision was made to band the RP internal hemorrhoid, and the [facility] O'Regan System was used to perform band ligation without complication.  Digital anorectal examination was then performed to assure proper positioning of the band, and to adjust the banded tissue as required.  The patient was discharged home without pain or other issues.  Dietary and behavioral recommendations were given and (if necessary - prescriptions were given), along with follow-up instructions.  The patient will return 2 weeks for follow-up and possible additional banding as required.  No complications were encountered and the patient tolerated the procedure well.  Cecile Gillispie R Abdulhadi Stopa, MD [address]  [address]  [place], [place] [address]  Main: [phone]  Fax: [phone] Pager: [phone]  

## 2017-10-08 ENCOUNTER — Ambulatory Visit: Payer: BC Managed Care – PPO | Admitting: Gastroenterology

## 2017-10-08 ENCOUNTER — Encounter: Payer: Self-pay | Admitting: Gastroenterology

## 2017-10-08 VITALS — BP 143/91 | HR 64 | Resp 16 | Ht 71.0 in | Wt 192.0 lb

## 2017-10-08 DIAGNOSIS — K640 First degree hemorrhoids: Secondary | ICD-10-CM | POA: Diagnosis not present

## 2017-10-08 NOTE — Progress Notes (Signed)
PROCEDURE NOTE: The patient presents with symptomatic grade 1 hemorrhoids, unresponsive to maximal medical therapy, requesting rubber band ligation of his/her hemorrhoidal disease.  All risks, benefits and alternative forms of therapy were described and informed consent was obtained.   The decision was made to band the RA internal hemorrhoid, and the [facility] O'Regan System was used to perform band ligation without complication.  Digital anorectal examination was then performed to assure proper positioning of the band, and to adjust the banded tissue as required.  The patient was discharged home without pain or other issues.  Dietary and behavioral recommendations were given and (if necessary - prescriptions were given), along with follow-up instructions.  The patient will return 3 weeks for follow-up and possible additional banding as required.  No complications were encountered and the patient tolerated the procedure well.  Arlyss Repress, MD [address]  [address]  [place], [place] [address]  Main: [phone]  Fax: [phone] Pager: [phone]

## 2017-11-13 ENCOUNTER — Ambulatory Visit: Payer: BC Managed Care – PPO | Admitting: Gastroenterology

## 2017-11-13 ENCOUNTER — Encounter: Payer: Self-pay | Admitting: Gastroenterology

## 2017-11-13 VITALS — BP 137/98 | HR 69 | Resp 16 | Ht 71.0 in | Wt 189.4 lb

## 2017-11-13 DIAGNOSIS — K640 First degree hemorrhoids: Secondary | ICD-10-CM | POA: Diagnosis not present

## 2017-11-13 NOTE — Progress Notes (Signed)
PROCEDURE NOTE: The patient presents with symptomatic grade 1 hemorrhoids, unresponsive to maximal medical therapy, requesting rubber band ligation of his/her hemorrhoidal disease.  All risks, benefits and alternative forms of therapy were described and informed consent was obtained.  The decision was made to band the LL internal hemorrhoid, and the [facility] O'Regan System was used to perform band ligation without complication.  Digital anorectal examination was then performed to assure proper positioning of the band, and to adjust the banded tissue as required.  The patient was discharged home without pain or other issues.  Dietary and behavioral recommendations were given and (if necessary - prescriptions were given), along with follow-up instructions.  The patient will return  as needed for follow-up and possible additional banding as required.  No complications were encountered and the patient tolerated the procedure well.  Brandee Markin R Kaylyn Garrow, MD [address]  [address]  [place],  [address]  Main: [phone]  Fax: [phone] Pager: [phone]   

## 2018-12-28 ENCOUNTER — Ambulatory Visit: Payer: Self-pay | Admitting: Family Medicine

## 2019-01-10 ENCOUNTER — Other Ambulatory Visit: Payer: Self-pay

## 2019-01-10 ENCOUNTER — Encounter: Payer: Self-pay | Admitting: Family Medicine

## 2019-01-10 ENCOUNTER — Ambulatory Visit (INDEPENDENT_AMBULATORY_CARE_PROVIDER_SITE_OTHER): Payer: BC Managed Care – PPO | Admitting: Family Medicine

## 2019-01-10 VITALS — BP 140/92 | HR 63 | Temp 98.4°F | Ht 71.0 in | Wt 181.0 lb

## 2019-01-10 DIAGNOSIS — Z125 Encounter for screening for malignant neoplasm of prostate: Secondary | ICD-10-CM

## 2019-01-10 DIAGNOSIS — E7800 Pure hypercholesterolemia, unspecified: Secondary | ICD-10-CM | POA: Diagnosis not present

## 2019-01-10 DIAGNOSIS — Z23 Encounter for immunization: Secondary | ICD-10-CM

## 2019-01-10 DIAGNOSIS — R030 Elevated blood-pressure reading, without diagnosis of hypertension: Secondary | ICD-10-CM

## 2019-01-10 NOTE — Progress Notes (Signed)
Patient: Joel Leon Male    DOB: 1965-12-19   53 y.o.   MRN: [id number] Visit Date: 01/10/2019  Today's Provider: Lelon Huh, MD    Subjective:     Hyperlipidemia This is a chronic problem. Recent lipid tests were reviewed and are high. There are no known factors aggravating his hyperlipidemia. Pertinent negatives include no chest pain, focal sensory loss, focal weakness, leg pain, myalgias or shortness of breath. Current antihyperlipidemic treatment includes diet change and exercise. There are no compliance problems.   He states he and his wife have been eating a lot of Asian food, mostly home cooked. States uses low sodium versions of Soy Sauce. Is exercising by walking a few miles just about every day and feels very well.    Lipid Panel     Component Value Date/Time   CHOL 259 (H) 03/25/2017 0953   TRIG 170 (H) 03/25/2017 0953   HDL 54 03/25/2017 0953   CHOLHDL 4.8 03/25/2017 0953   LDLCALC 173 (H) 03/25/2017 0953      Wt Readings from Last 3 Encounters:  11/13/17 189 lb 6.4 oz (85.9 kg)  10/08/17 192 lb (87.1 kg)  09/25/17 195 lb (88.5 kg)    BP Readings from Last 3 Encounters:  01/10/19 (!) 167/103  11/13/17 (!) 137/98  10/08/17 (!) 143/91     Allergies  Allergen Reactions  . Aller-Chlor  [Chlorpheniramine Maleate] Rash  . Antihistamines, Diphenhydramine-Type Rash  . Decongestant [Oxymetazoline]   . Lactose   . Milk Protein   . Milk-Related Compounds   . Sudafed [Pseudoephedrine Hcl]   . Antihistamines, Chlorpheniramine-Type Rash    Gerilyn Nestle- Johnsons' syndrome Burns  . Brompheniramine Rash    burns     Current Outpatient Medications:  .  esomeprazole (NEXIUM) 20 MG capsule, Take 20 mg by mouth daily as needed., Disp: , Rfl:   Review of Systems  Constitutional: Negative.   Respiratory: Negative.  Negative for shortness of breath.   Cardiovascular: Negative.  Negative for chest pain.  Gastrointestinal: Negative.   Musculoskeletal:  Negative.  Negative for myalgias.  Neurological: Negative for dizziness, focal weakness and headaches.    Social History   Tobacco Use  . Smoking status: Never Smoker  . Smokeless tobacco: Never Used  Substance Use Topics  . Alcohol use: Yes    Alcohol/week: 0.0 standard drinks    Comment: 2-3 DRINKS PER DAY      Objective:    Vitals:   01/10/19 0812 01/10/19 0825  BP: (!) 167/103 (!) 140/92  Pulse: 63   Temp: 98.4 F (36.9 C)   TempSrc: Oral   Weight: 181 lb (82.1 kg)   Height: 5\' 11"  (1.803 m)      Physical Exam   General Appearance:    Alert, cooperative, no distress  Eyes:    PERRL, conjunctiva/corneas clear, EOM's intact       Lungs:     Clear to auscultation bilaterally, respirations unlabored  Heart:    Normal heart rate. Normal rhythm. No murmurs, rubs, or gallops.   Neurologic:   Awake, alert, oriented x 3. No apparent focal neurological           defect.          Assessment & Plan    1. Hypercholesterolemia Has made significant improvements in diet.  - Comprehensive metabolic panel - Lipid panel  2. Prostate cancer screening  - PSA  3. Elevated blood-pressure reading without diagnosis of hypertension Is  to check BP with home monitor 2-3 times a week.  - TSH  4. Need for prophylactic vaccination using tetanus and diphtheria toxoids adsorbed (Td) vaccine  - Tdap vaccine greater than or equal to 7yo IM  5. Need for shingles vaccine  - Varicella-zoster vaccine IM  The entirety of the information documented in the History of Present Illness, Review of Systems and Physical Exam were personally obtained by me. Portions of this information were initially documented by Kavin LeechLaura Walsh, CMA and reviewed by me for thoroughness and accuracy.      Mila Merryonald Fisher, MD  [facility]

## 2019-01-10 NOTE — Patient Instructions (Addendum)
. Please review the attached list of medications and notify my office if there are any errors.   . Please bring all of your medications to every appointment so we can make sure that our medication list is the same as yours.   . We will have flu vaccines available after Labor Day. Please go to your pharmacy or call the office in early September to schedule you flu shot.   DASH Eating Plan DASH stands for "Dietary Approaches to Stop Hypertension." The DASH eating plan is a healthy eating plan that has been shown to reduce high blood pressure (hypertension). It may also reduce your risk for type 2 diabetes, heart disease, and stroke. The DASH eating plan may also help with weight loss. What are tips for following this plan?  General guidelines  Avoid eating more than 2,300 mg (milligrams) of salt (sodium) a day. If you have hypertension, you may need to reduce your sodium intake to 1,500 mg a day.  Limit alcohol intake to no more than 1 drink a day for nonpregnant women and 2 drinks a day for men. One drink equals 12 oz of beer, 5 oz of wine, or 1 oz of hard liquor.  Work with your health care provider to maintain a healthy body weight or to lose weight. Ask what an ideal weight is for you.  Get at least 30 minutes of exercise that causes your heart to beat faster (aerobic exercise) most days of the week. Activities may include walking, swimming, or biking.  Work with your health care provider or diet and nutrition specialist (dietitian) to adjust your eating plan to your individual calorie needs. Reading food labels   Check food labels for the amount of sodium per serving. Choose foods with less than 5 percent of the Daily Value of sodium. Generally, foods with less than 300 mg of sodium per serving fit into this eating plan.  To find whole grains, look for the word "whole" as the first word in the ingredient list. Shopping  Buy products labeled as "low-sodium" or "no salt added."  Buy  fresh foods. Avoid canned foods and premade or frozen meals. Cooking  Avoid adding salt when cooking. Use salt-free seasonings or herbs instead of table salt or sea salt. Check with your health care provider or pharmacist before using salt substitutes.  Do not fry foods. Cook foods using healthy methods such as baking, boiling, grilling, and broiling instead.  Cook with heart-healthy oils, such as olive, canola, soybean, or sunflower oil. Meal planning  Eat a balanced diet that includes: ? 5 or more servings of fruits and vegetables each day. At each meal, try to fill half of your plate with fruits and vegetables. ? Up to 6-8 servings of whole grains each day. ? Less than 6 oz of lean meat, poultry, or fish each day. A 3-oz serving of meat is about the same size as a deck of cards. One egg equals 1 oz. ? 2 servings of low-fat dairy each day. ? A serving of nuts, seeds, or beans 5 times each week. ? Heart-healthy fats. Healthy fats called Omega-3 fatty acids are found in foods such as flaxseeds and coldwater fish, like sardines, salmon, and mackerel.  Limit how much you eat of the following: ? Canned or prepackaged foods. ? Food that is high in trans fat, such as fried foods. ? Food that is high in saturated fat, such as fatty meat. ? Sweets, desserts, sugary drinks, and other foods with   added sugar. ? Full-fat dairy products.  Do not salt foods before eating.  Try to eat at least 2 vegetarian meals each week.  Eat more home-cooked food and less restaurant, buffet, and fast food.  When eating at a restaurant, ask that your food be prepared with less salt or no salt, if possible. What foods are recommended? The items listed may not be a complete list. Talk with your dietitian about what dietary choices are best for you. Grains Whole-grain or whole-wheat bread. Whole-grain or whole-wheat pasta. Brown rice. Oatmeal. Quinoa. Bulgur. Whole-grain and low-sodium cereals. Pita bread.  Low-fat, low-sodium crackers. Whole-wheat flour tortillas. Vegetables Fresh or frozen vegetables (raw, steamed, roasted, or grilled). Low-sodium or reduced-sodium tomato and vegetable juice. Low-sodium or reduced-sodium tomato sauce and tomato paste. Low-sodium or reduced-sodium canned vegetables. Fruits All fresh, dried, or frozen fruit. Canned fruit in natural juice (without added sugar). Meat and other protein foods Skinless chicken or turkey. Ground chicken or turkey. Pork with fat trimmed off. Fish and seafood. Egg whites. Dried beans, peas, or lentils. Unsalted nuts, nut butters, and seeds. Unsalted canned beans. Lean cuts of beef with fat trimmed off. Low-sodium, lean deli meat. Dairy Low-fat (1%) or fat-free (skim) milk. Fat-free, low-fat, or reduced-fat cheeses. Nonfat, low-sodium ricotta or cottage cheese. Low-fat or nonfat yogurt. Low-fat, low-sodium cheese. Fats and oils Soft margarine without trans fats. Vegetable oil. Low-fat, reduced-fat, or light mayonnaise and salad dressings (reduced-sodium). Canola, safflower, olive, soybean, and sunflower oils. Avocado. Seasoning and other foods Herbs. Spices. Seasoning mixes without salt. Unsalted popcorn and pretzels. Fat-free sweets. What foods are not recommended? The items listed may not be a complete list. Talk with your dietitian about what dietary choices are best for you. Grains Baked goods made with fat, such as croissants, muffins, or some breads. Dry pasta or rice meal packs. Vegetables Creamed or fried vegetables. Vegetables in a cheese sauce. Regular canned vegetables (not low-sodium or reduced-sodium). Regular canned tomato sauce and paste (not low-sodium or reduced-sodium). Regular tomato and vegetable juice (not low-sodium or reduced-sodium). Pickles. Olives. Fruits Canned fruit in a light or heavy syrup. Fried fruit. Fruit in cream or butter sauce. Meat and other protein foods Fatty cuts of meat. Ribs. Fried meat. Bacon.  Sausage. Bologna and other processed lunch meats. Salami. Fatback. Hotdogs. Bratwurst. Salted nuts and seeds. Canned beans with added salt. Canned or smoked fish. Whole eggs or egg yolks. Chicken or turkey with skin. Dairy Whole or 2% milk, cream, and half-and-half. Whole or full-fat cream cheese. Whole-fat or sweetened yogurt. Full-fat cheese. Nondairy creamers. Whipped toppings. Processed cheese and cheese spreads. Fats and oils Butter. Stick margarine. Lard. Shortening. Ghee. Bacon fat. Tropical oils, such as coconut, palm kernel, or palm oil. Seasoning and other foods Salted popcorn and pretzels. Onion salt, garlic salt, seasoned salt, table salt, and sea salt. Worcestershire sauce. Tartar sauce. Barbecue sauce. Teriyaki sauce. Soy sauce, including reduced-sodium. Steak sauce. Canned and packaged gravies. Fish sauce. Oyster sauce. Cocktail sauce. Horseradish that you find on the shelf. Ketchup. Mustard. Meat flavorings and tenderizers. Bouillon cubes. Hot sauce and Tabasco sauce. Premade or packaged marinades. Premade or packaged taco seasonings. Relishes. Regular salad dressings. Where to find more information:  National Heart, Lung, and Blood Institute: www.nhlbi.nih.gov  American Heart Association: www.heart.org Summary  The DASH eating plan is a healthy eating plan that has been shown to reduce high blood pressure (hypertension). It may also reduce your risk for type 2 diabetes, heart disease, and stroke.  With the DASH   eating plan, you should limit salt (sodium) intake to 2,300 mg a day. If you have hypertension, you may need to reduce your sodium intake to 1,500 mg a day.  When on the DASH eating plan, aim to eat more fresh fruits and vegetables, whole grains, lean proteins, low-fat dairy, and heart-healthy fats.  Work with your health care provider or diet and nutrition specialist (dietitian) to adjust your eating plan to your individual calorie needs. This information is not intended  to replace advice given to you by your health care provider. Make sure you discuss any questions you have with your health care provider. Document Released: 05/08/2011 Document Revised: 05/01/2017 Document Reviewed: 05/12/2016 Elsevier Patient Education  2020 Elsevier Inc.  

## 2019-01-11 LAB — LIPID PANEL
Chol/HDL Ratio: 3.5 ratio (ref 0.0–5.0)
Cholesterol, Total: 219 mg/dL — ABNORMAL HIGH (ref 100–199)
HDL: 62 mg/dL (ref >39)
LDL Calculated: 141 mg/dL — ABNORMAL HIGH (ref 0–99)
Triglycerides: 78 mg/dL (ref 0–149)
VLDL Cholesterol Cal: 16 mg/dL (ref 5–40)

## 2019-01-11 LAB — COMPREHENSIVE METABOLIC PANEL
ALT: 28 IU/L (ref 0–44)
AST: 22 IU/L (ref 0–40)
Albumin/Globulin Ratio: 2 (ref 1.2–2.2)
Albumin: 4.6 g/dL (ref 4.0–5.0)
Alkaline Phosphatase: 54 IU/L (ref 39–117)
BUN/Creatinine Ratio: 20 (ref 9–20)
BUN: 17 mg/dL (ref 6–24)
Bilirubin Total: 0.6 mg/dL (ref 0.0–1.2)
CO2: 22 mmol/L (ref 20–29)
Calcium: 9.5 mg/dL (ref 8.7–10.2)
Chloride: 104 mmol/L (ref 96–106)
Creatinine, Ser: 0.87 mg/dL (ref 0.76–1.27)
GFR calc Af Amer: 116 mL/min/{1.73_m2} (ref >59)
GFR calc non Af Amer: 101 mL/min/{1.73_m2} (ref >59)
Globulin, Total: 2.3 g/dL (ref 1.5–4.5)
Glucose: 106 mg/dL — ABNORMAL HIGH (ref 65–99)
Potassium: 4.2 mmol/L (ref 3.5–5.2)
Sodium: 141 mmol/L (ref 134–144)
Total Protein: 6.9 g/dL (ref 6.0–8.5)

## 2019-01-11 LAB — PSA: Prostate Specific Ag, Serum: 2 ng/mL (ref 0.0–4.0)

## 2019-01-11 LAB — TSH: TSH: 2.52 u[IU]/mL (ref 0.450–4.500)

## 2019-03-16 ENCOUNTER — Ambulatory Visit (INDEPENDENT_AMBULATORY_CARE_PROVIDER_SITE_OTHER): Payer: BC Managed Care – PPO | Admitting: Family Medicine

## 2019-03-16 ENCOUNTER — Other Ambulatory Visit: Payer: Self-pay

## 2019-03-16 ENCOUNTER — Encounter: Payer: Self-pay | Admitting: Family Medicine

## 2019-03-16 VITALS — BP 132/89 | HR 70 | Temp 96.8°F | Resp 16 | Wt 175.0 lb

## 2019-03-16 DIAGNOSIS — I10 Essential (primary) hypertension: Secondary | ICD-10-CM | POA: Diagnosis not present

## 2019-03-16 DIAGNOSIS — Z23 Encounter for immunization: Secondary | ICD-10-CM | POA: Diagnosis not present

## 2019-03-16 MED ORDER — AMLODIPINE BESYLATE 5 MG PO TABS: 5.0000 mg | ORAL_TABLET | Freq: Every day | ORAL | 1 refills | Status: DC

## 2019-03-16 NOTE — Addendum Note (Signed)
Addended by: Minette Headland on: 03/16/2019 09:07 AM   Modules accepted: Orders

## 2019-03-16 NOTE — Progress Notes (Signed)
Patient: Joel Leon Male    DOB: 11-06-1965   53 y.o.   MRN: [id number] Visit Date: 03/16/2019  Today's Provider: Mila Merry, MD   Chief Complaint  Patient presents with  . Hypertension   Subjective:     HPI  Hypertension, follow-up:  BP Readings from Last 3 Encounters:  03/16/19 132/89  01/10/19 (!) 140/92  11/13/17 (!) 137/98    He was last seen for hypertension 2 months ago.  BP at that visit was 140/92. Management changes since that visit include advising patient to monitor b/p at home 2-3x a week.  He is exercising. He is adherent to low salt diet.   Outside blood pressures are systolic in range from 135-146 and diastolic 90-94.  Patient denies chest pain, chest pressure/discomfort, claudication, dyspnea, exertional chest pressure/discomfort, fatigue, irregular heart beat, lower extremity edema, near-syncope, orthopnea, palpitations, paroxysmal nocturnal dyspnea, syncope and tachypnea.   Cardiovascular risk factors include hypertension and male gender.  Use of agents associated with hypertension: none.     Weight trend: stable Wt Readings from Last 3 Encounters:  03/16/19 175 lb (79.4 kg)  01/10/19 181 lb (82.1 kg)  11/13/17 189 lb 6.4 oz (85.9 kg)    Current diet: in general, a "healthy" diet    ------------------------------------------------------------------------  Allergies  Allergen Reactions  . Aller-Chlor  [Chlorpheniramine Maleate] Rash  . Antihistamines, Diphenhydramine-Type Rash  . Decongestant [Oxymetazoline]   . Lactose   . Milk Protein   . Milk-Related Compounds   . Sudafed [Pseudoephedrine Hcl]   . Antihistamines, Chlorpheniramine-Type Rash    Andria Meuse- Johnsons' syndrome Burns  . Brompheniramine Rash    burns     Current Outpatient Medications:  .  esomeprazole (NEXIUM) 20 MG capsule, Take 20 mg by mouth daily as needed., Disp: , Rfl:   Review of Systems  Social History   Tobacco Use  . Smoking status: Never  Smoker  . Smokeless tobacco: Never Used  Substance Use Topics  . Alcohol use: Yes    Alcohol/week: 0.0 standard drinks    Comment: 2-3 DRINKS PER DAY      Objective:   BP 132/89   Pulse 70   Temp (!) 96.8 F (36 C) (Oral)   Resp 16   Wt 175 lb (79.4 kg)   BMI 24.41 kg/m  Vitals:   03/16/19 0811  BP: 132/89  Pulse: 70  Resp: 16  Temp: (!) 96.8 F (36 C)  TempSrc: Oral  Weight: 175 lb (79.4 kg)  Body mass index is 24.41 kg/m.   Physical Exam  General appearance: Well developed, well nourished male, cooperative and in no acute distress Head: Normocephalic, without obvious abnormality, atraumatic Respiratory: Respirations even and unlabored, normal respiratory rate .     Assessment & Plan    1. Essential hypertension Home BP readings have been hovering around 140/90. He states his wife who is a pharmacist feels he should be on something for his blood pressure. He doesn't feel there are any additional improvements to be made with lifestyle.  - amLODipine (NORVASC) 5 MG tablet; Take 1 tablet (5 mg total) by mouth daily.  Dispense: 30 tablet; Refill: 1  He reports [facility] his office last week.   He is going to send a MyChart message with his home BP readings in about a month.   The entirety of the information documented in the History of Present Illness, Review of Systems and  Physical Exam were personally obtained by me. Portions of this information were initially documented by Minette Headland, CMA and reviewed by me for thoroughness and accuracy.      Lelon Huh, MD  [facility]

## 2019-03-16 NOTE — Patient Instructions (Addendum)
.   Please review the attached list of medications and notify my office if there are any errors.   . Please bring all of your medications to every appointment so we can make sure that our medication list is the same as yours.   . Send me a MyChart message in about a month and let me know how your home Blood Pressure measurements are doing. If improved i'll send in 90 day refills for amlodiine

## 2019-05-22 ENCOUNTER — Other Ambulatory Visit: Payer: Self-pay | Admitting: Family Medicine

## 2019-05-22 DIAGNOSIS — I10 Essential (primary) hypertension: Secondary | ICD-10-CM

## 2019-06-11 ENCOUNTER — Other Ambulatory Visit: Payer: Self-pay

## 2019-06-11 ENCOUNTER — Ambulatory Visit
Admission: EM | Admit: 2019-06-11 | Discharge: 2019-06-11 | Disposition: A | Discharge status: Home / Self Care | Payer: BC Managed Care – PPO | Attending: Family Medicine | Admitting: Family Medicine

## 2019-06-11 ENCOUNTER — Encounter: Payer: Self-pay | Admitting: Emergency Medicine

## 2019-06-11 DIAGNOSIS — L03011 Cellulitis of right finger: Secondary | ICD-10-CM

## 2019-06-11 DIAGNOSIS — W262XXA Contact with edge of stiff paper, initial encounter: Secondary | ICD-10-CM | POA: Diagnosis not present

## 2019-06-11 MED ORDER — MUPIROCIN 2 % EX OINT: TOPICAL_OINTMENT | CUTANEOUS | 0 refills | Status: AC

## 2019-06-11 MED ORDER — SULFAMETHOXAZOLE-TRIMETHOPRIM 800-160 MG PO TABS: 1.0000 | ORAL_TABLET | Freq: Two times a day (BID) | ORAL | 0 refills | Status: AC

## 2019-06-11 NOTE — Discharge Instructions (Addendum)
Take medication as prescribed. Keep clean. Warm soapy water soaks.   Follow up with your primary care physician this week as needed. Return to Urgent care for new or worsening concerns.

## 2019-06-11 NOTE — ED Triage Notes (Signed)
Pt c/o right index finger pain, swelling and redness. Started about 2 days ago.

## 2019-06-11 NOTE — ED Provider Notes (Addendum)
[facility] ____________________________________________  Time seen: Approximately 12:55 PM  I have reviewed the triage vital signs and the nursing notes.   HISTORY  Chief Complaint Hand Pain (right index finger)   HPI Joel Leon is a 54 y.o. male presenting for evaluation of redness and tenderness to right index finger present for the last few days.  Reports initially had a small paper cut and when he grabbed his satchel it rubbed along that area.  Denies any blunt trauma or crushing trauma to his finger.  Continues with full range of motion.  Denies pain radiation or paresthesias.  Denies drainage.  States coming in because he noticed that it looked infected.  Reports tetanus immunization is up-to-date.  Denies aggravating or alleviating factors otherwise.  Reports otherwise doing well.  No recent sickness.   Past Medical History:  Diagnosis Date  . Allergy   . GERD (gastroesophageal reflux disease)     Patient Active Problem List   Diagnosis Date Noted  . Colon cancer screening   . History of chicken pox 06/03/2017  . Acid reflux 03/24/2017  . Hypercholesterolemia 03/24/2017  . 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 Tear of medial meniscus of knee 01/29/2015  . Hernia of abdominal cavity     Past Surgical History:  Procedure Laterality Date  . COLONOSCOPY WITH PROPOFOL N/A 08/11/2017   Procedure: COLONOSCOPY WITH PROPOFOL;  Surgeon: Toney Reil, MD;  Location: [facility] ENDOSCOPY;  Service: Gastroenterology;  Laterality: N/A;  . INGUINAL HERNIA REPAIR N/A 01/23/2015   Procedure: Laparoscopic right inguinal hernia repair ;  Surgeon: Natale Lay, MD;  Location: [facility] ORS;  Service: General;  Laterality: N/A;  . KNEE ARTHROSCOPY W/ PARTIAL MEDIAL MENISCECTOMY Left 02/22/2015   Dr. Fay Records  . MOUTH SURGERY    . VASECTOMY  11/01/2012     No current facility-administered medications for this encounter.  Current Outpatient Medications:  .  amLODipine  (NORVASC) 5 MG tablet, TAKE 1 TABLET BY MOUTH EVERY DAY, Disp: 30 tablet, Rfl: 1 .  mupirocin ointment (BACTROBAN) 2 %, Apply two times a day for 5 days., Disp: 22 g, Rfl: 0 .  sulfamethoxazole-trimethoprim (BACTRIM DS) 800-160 MG tablet, Take 1 tablet by mouth 2 (two) times daily for 7 days., Disp: 14 tablet, Rfl: 0  Allergies Aller-chlor  [chlorpheniramine maleate]; Antihistamines, diphenhydramine-type; Decongestant [oxymetazoline]; Lactose; Milk protein; Milk-related compounds; Sudafed [pseudoephedrine hcl]; Antihistamines, chlorpheniramine-type; and Brompheniramine  Family History  Problem Relation Age of Onset  . Dementia Maternal Grandmother     Social History Social History   Tobacco Use  . Smoking status: Never Smoker  . Smokeless tobacco: Never Used  Substance Use Topics  . Alcohol use: Yes    Alcohol/week: 0.0 standard drinks    Comment: 2-3 DRINKS PER DAY  . Drug use: No    Review of Systems Constitutional: No fever Cardiovascular: Denies chest pain. Respiratory: Denies shortness of breath. Skin: Positive skin changes.   ____________________________________________   PHYSICAL EXAM:  VITAL SIGNS: ED Triage Vitals  Enc Vitals Group     BP 06/11/19 1220 (!) 151/100     Pulse Rate 06/11/19 1220 65     Resp 06/11/19 1220 18     Temp 06/11/19 1220 98.5 F (36.9 C)     Temp Source 06/11/19 1220 Oral     SpO2 06/11/19 1220 99 %     Weight 06/11/19 1217 179 lb (81.2 kg)     Height 06/11/19 1217 5\' 11"  (1.803 m)  Head Circumference --      Peak Flow --      Pain Score 06/11/19 1216 2     Pain Loc --      Pain Edu? --      Excl. in [place]? --     Constitutional: Alert and oriented. Well appearing and in no acute distress. Eyes: Conjunctivae are normal.  ENT      Head: Normocephalic and atraumatic. Cardiovascular: Normal heart rate. Good peripheral circulation. Respiratory: Normal respiratory effort without tachypnea nor retractions. Musculoskeletal:Steady  gait.  Neurologic:  Normal speech and language. Speech is normal. No gait instability.  Skin:  Skin is warm, dry.  Except: right distal index finger lateral proximal nail base area of fluctuance with paronychia with localized erythema, no circumferential erythema, no bony tenderness, full range of motion present, good distal resisted flexion and extension, normal sensation, no drainage. Psychiatric: Mood and affect are normal. Speech and behavior are normal. Patient exhibits appropriate insight and judgment   ___________________________________________   LABS (all labs ordered are listed, but only abnormal results are displayed)  Labs Reviewed - No data to display   PROCEDURES Procedures  Right index finger abscess drained. Procedure explained and verbal consent obtained. Area cleaned and prepped with Betadine. Anesthesia: none.  Sterile 11 blade scalpel utilized to drain right index finger paronychia.  Patient tolerated well.  Immediate moderate amount of purulent drainage.  Dressing applied.  INITIAL IMPRESSION / ASSESSMENT AND PLAN / ED COURSE  Pertinent labs & imaging results that were available during my care of the patient were reviewed by me and considered in my medical decision making (see chart for details).  Well-appearing patient.  Right index finger paronychia.  No bony tenderness.  I&D completed.  Patient tolerated well.  Will treat with oral Bactrim and topical Bactroban.  Warm soapy water soaks and monitoring.  Discussed indication, risks and benefits of medications with patient. Discussed follow up and return parameters including no resolution or any worsening concerns. Patient verbalized understanding and agreed to plan.   ____________________________________________   FINAL CLINICAL IMPRESSION(S) / ED DIAGNOSES  Final diagnoses:  Paronychia of right index finger     ED Discharge Orders         Ordered    sulfamethoxazole-trimethoprim (BACTRIM DS) 800-160 MG  tablet  2 times daily     06/11/19 1256    mupirocin ointment (BACTROBAN) 2 %     06/11/19 1256           Note: This dictation was prepared with Dragon dictation along with smaller phrase technology. Any transcriptional errors that result from this process are unintentional.         Marylene Land, NP 06/11/19 1400

## 2019-06-14 ENCOUNTER — Other Ambulatory Visit: Payer: Self-pay | Admitting: Family Medicine

## 2019-06-14 DIAGNOSIS — I10 Essential (primary) hypertension: Secondary | ICD-10-CM

## 2019-07-16 ENCOUNTER — Other Ambulatory Visit: Payer: Self-pay | Admitting: Family Medicine

## 2019-07-16 DIAGNOSIS — I10 Essential (primary) hypertension: Secondary | ICD-10-CM

## 2019-07-16 NOTE — Telephone Encounter (Signed)
Requested Prescriptions  Pending Prescriptions Disp Refills  . amLODipine (NORVASC) 5 MG tablet [Pharmacy Med Name: AMLODIPINE BESYLATE 5 MG TAB] 30 tablet 1    Sig: TAKE 1 TABLET BY MOUTH EVERY DAY     Cardiovascular:  Calcium Channel Blockers Failed - 07/16/2019  8:31 AM      Failed - Last BP in normal range    BP Readings from Last 1 Encounters:  06/11/19 (!) 151/100         Passed - Valid encounter within last 6 months    Recent Outpatient Visits          4 months ago Essential hypertension   [facility] Malva Limes, MD   6 months ago Hypercholesterolemia   [facility] Malva Limes, MD   2 years ago Annual physical exam   [facility] Malva Limes, MD

## 2019-08-10 ENCOUNTER — Other Ambulatory Visit: Payer: Self-pay | Admitting: Family Medicine

## 2019-08-10 DIAGNOSIS — I10 Essential (primary) hypertension: Secondary | ICD-10-CM

## 2019-11-15 ENCOUNTER — Other Ambulatory Visit: Payer: Self-pay | Admitting: Family Medicine

## 2019-11-15 DIAGNOSIS — I10 Essential (primary) hypertension: Secondary | ICD-10-CM

## 2019-11-15 NOTE — Telephone Encounter (Signed)
Requested Prescriptions  Pending Prescriptions Disp Refills  . amLODipine (NORVASC) 5 MG tablet [Pharmacy Med Name: AMLODIPINE BESYLATE 5 MG TAB] 30 tablet 0    Sig: TAKE 1 TABLET BY MOUTH EVERY DAY     Cardiovascular:  Calcium Channel Blockers Failed - 11/15/2019  1:25 AM      Failed - Last BP in normal range    BP Readings from Last 1 Encounters:  06/11/19 (!) 151/100         Failed - Valid encounter within last 6 months    Recent Outpatient Visits          8 months ago Essential hypertension   [facility] Malva Limes, MD   10 months ago Hypercholesterolemia   [facility] Malva Limes, MD   2 years ago Annual physical exam   [facility] Malva Limes, MD

## 2019-12-23 ENCOUNTER — Other Ambulatory Visit: Payer: Self-pay | Admitting: Family Medicine

## 2019-12-23 DIAGNOSIS — I10 Essential (primary) hypertension: Secondary | ICD-10-CM

## 2019-12-23 NOTE — Telephone Encounter (Signed)
Requested medication (s) are due for refill today: no  Requested medication (s) are on the active medication list: yes  Last refill:  11/30/2019  Future visit scheduled:no  Notes to clinic:  patient given courtesy refill Overdue for follow up   Requested Prescriptions  Pending Prescriptions Disp Refills   amLODipine (NORVASC) 5 MG tablet [Pharmacy Med Name: AMLODIPINE BESYLATE 5 MG TAB] 30 tablet 0    Sig: TAKE 1 TABLET BY MOUTH EVERY DAY      Cardiovascular:  Calcium Channel Blockers Failed - 12/23/2019  1:35 PM      Failed - Last BP in normal range    BP Readings from Last 1 Encounters:  06/11/19 (!) 151/100          Failed - Valid encounter within last 6 months    Recent Outpatient Visits           9 months ago Essential hypertension   [facility] Malva Limes, MD   11 months ago Hypercholesterolemia   [facility] Malva Limes, MD   2 years ago Annual physical exam   [facility] Malva Limes, MD

## 2020-02-04 ENCOUNTER — Other Ambulatory Visit: Payer: Self-pay | Admitting: Family Medicine

## 2020-02-04 DIAGNOSIS — I10 Essential (primary) hypertension: Secondary | ICD-10-CM

## 2020-02-04 NOTE — Telephone Encounter (Signed)
Requested  medications are  due for refill today yes  Requested medications are on the active medication list yes  Last refill 8/13  Last visit 03/2019  Future visit scheduled no  Notes to clinic Already given a curtesy refill, no upcoming visit scheduled.

## 2020-02-16 ENCOUNTER — Other Ambulatory Visit: Payer: Self-pay | Admitting: Family Medicine

## 2020-02-16 DIAGNOSIS — I10 Essential (primary) hypertension: Secondary | ICD-10-CM

## 2020-02-16 NOTE — Telephone Encounter (Signed)
Medication Refill - Medication: amlodipine   Has the patient contacted their pharmacy? Yes.   Pt states that he was not able to receive first prescription at the pharmacy. Please advise.  (Agent: If no, request that the patient contact the pharmacy for the refill.) (Agent: If yes, when and what did the pharmacy advise?)  Preferred Pharmacy (with phone number or street name):  [facility] [place], [place] - [address]  [address] [place] [address]  Phone: [phone] Fax: [phone]  Hours: Not open 24 hours     Agent: Please be advised that RX refills may take up to 3 business days. We ask that you follow-up with your pharmacy.

## 2020-02-16 NOTE — Telephone Encounter (Signed)
   Notes to clinic:  Patient still has not schedule follow up appointment  Last visit was 03/2019  Review for refill  Requested Prescriptions  Pending Prescriptions Disp Refills   amLODipine (NORVASC) 5 MG tablet 30 tablet 1    Sig: Take 1 tablet (5 mg total) by mouth daily.      Cardiovascular:  Calcium Channel Blockers Failed - 02/16/2020 10:54 AM      Failed - Last BP in normal range    BP Readings from Last 1 Encounters:  06/11/19 (!) 151/100          Failed - Valid encounter within last 6 months    Recent Outpatient Visits           11 months ago Essential hypertension   [facility] Malva Limes, MD   1 year ago Hypercholesterolemia   [facility] Malva Limes, MD   2 years ago Annual physical exam   [facility] Malva Limes, MD

## 2020-03-11 ENCOUNTER — Other Ambulatory Visit: Payer: Self-pay | Admitting: Family Medicine

## 2020-03-11 DIAGNOSIS — I10 Essential (primary) hypertension: Secondary | ICD-10-CM

## 2020-03-11 NOTE — Telephone Encounter (Signed)
Called pt but no answer. Pt previously given courtesy RF. Last RF 02/05/20. Refused prescription request.

## 2020-04-19 ENCOUNTER — Other Ambulatory Visit: Payer: Self-pay | Admitting: Family Medicine

## 2020-04-19 DIAGNOSIS — I10 Essential (primary) hypertension: Secondary | ICD-10-CM

## 2020-04-19 NOTE — Telephone Encounter (Signed)
Call to patient- patient states he has to change providers due to insurance - courtesy refill given #30

## 2020-05-12 ENCOUNTER — Other Ambulatory Visit: Payer: Self-pay | Admitting: Family Medicine

## 2020-05-12 DIAGNOSIS — I10 Essential (primary) hypertension: Secondary | ICD-10-CM

## 2020-05-12 NOTE — Telephone Encounter (Signed)
Requested medication (s) are due for refill today: yes  Requested medication (s) are on the active medication list: yes  Last refill:  04/19/20 #30 courtesy refill  Future visit scheduled: no  Notes to clinic:  called pt but no answer. Needs appt   Requested Prescriptions  Pending Prescriptions Disp Refills   amLODipine (NORVASC) 5 MG tablet [Pharmacy Med Name: AMLODIPINE BESYLATE 5 MG TAB] 30 tablet 0    Sig: TAKE 1 TABLET BY MOUTH EVERY DAY      Cardiovascular:  Calcium Channel Blockers Failed - 05/12/2020 11:31 AM      Failed - Last BP in normal range    BP Readings from Last 1 Encounters:  06/11/19 (!) 151/100          Failed - Valid encounter within last 6 months    Recent Outpatient Visits           1 year ago Essential hypertension   [facility] Malva Limes, MD   1 year ago Hypercholesterolemia   [facility] Malva Limes, MD   3 years ago Annual physical exam   [facility] Malva Limes, MD
